# Patient Record
Sex: Male | Born: 1995 | Race: Black or African American | Hispanic: No | Marital: Married | Smoking: Former smoker
Health system: Southern US, Community
[De-identification: ages and names within clinical notes are randomized; demographics above are authoritative.]

## PROBLEM LIST (undated history)

## (undated) DIAGNOSIS — J309 Allergic rhinitis, unspecified: Principal | ICD-10-CM

## (undated) DIAGNOSIS — E669 Obesity, unspecified: Secondary | ICD-10-CM

## (undated) DIAGNOSIS — E559 Vitamin D deficiency, unspecified: Secondary | ICD-10-CM

## (undated) HISTORY — DX: Allergic rhinitis, unspecified: J30.9

## (undated) HISTORY — DX: Obesity, unspecified: E66.9

## (undated) HISTORY — DX: Hypocalcemia: E83.51

## (undated) HISTORY — DX: Vitamin D deficiency, unspecified: E55.9

## (undated) HISTORY — PX: ADENOIDECTOMY: SUR15

---
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
## 2013-02-08 ENCOUNTER — Emergency Department (HOSPITAL_COMMUNITY)
Admission: EM | Admit: 2013-02-08 | Discharge: 2013-02-08 | Disposition: A | Discharge status: Home / Self Care | Payer: Medicaid Other | Attending: Emergency Medicine | Admitting: Emergency Medicine

## 2013-02-08 ENCOUNTER — Encounter (HOSPITAL_COMMUNITY): Payer: Self-pay | Admitting: Emergency Medicine

## 2013-02-08 ENCOUNTER — Emergency Department (HOSPITAL_COMMUNITY): Payer: Medicaid Other

## 2013-02-08 DIAGNOSIS — S63602A Unspecified sprain of left thumb, initial encounter: Secondary | ICD-10-CM

## 2013-02-08 DIAGNOSIS — IMO0002 Reserved for concepts with insufficient information to code with codable children: Secondary | ICD-10-CM | POA: Insufficient documentation

## 2013-02-08 DIAGNOSIS — Y9289 Other specified places as the place of occurrence of the external cause: Secondary | ICD-10-CM | POA: Insufficient documentation

## 2013-02-08 DIAGNOSIS — S6000XA Contusion of unspecified finger without damage to nail, initial encounter: Secondary | ICD-10-CM | POA: Insufficient documentation

## 2013-02-08 DIAGNOSIS — E669 Obesity, unspecified: Secondary | ICD-10-CM | POA: Insufficient documentation

## 2013-02-08 DIAGNOSIS — S60012A Contusion of left thumb without damage to nail, initial encounter: Secondary | ICD-10-CM

## 2013-02-08 DIAGNOSIS — S6390XA Sprain of unspecified part of unspecified wrist and hand, initial encounter: Secondary | ICD-10-CM | POA: Insufficient documentation

## 2013-02-08 DIAGNOSIS — Y9389 Activity, other specified: Secondary | ICD-10-CM | POA: Insufficient documentation

## 2013-02-08 MED ORDER — IBUPROFEN 800 MG PO TABS
800.0000 mg | ORAL_TABLET | Freq: Once | ORAL | Status: AC
  Administered 2013-02-08: 800 mg via ORAL
  Filled 2013-02-08: qty 1

## 2013-02-08 MED ORDER — ACETAMINOPHEN 500 MG PO TABS
500.0000 mg | ORAL_TABLET | Freq: Once | ORAL | Status: AC
  Administered 2013-02-08: 500 mg via ORAL
  Filled 2013-02-08: qty 1

## 2013-02-08 MED ORDER — IBUPROFEN 800 MG PO TABS: 800.0000 mg | ORAL_TABLET | Freq: Three times a day (TID) | ORAL | Status: DC

## 2013-02-08 NOTE — ED Notes (Signed)
Pt reports was at work yesterday and accidentally hit left hand on the wall.  C/O pain and swelling to base of left thumb.

## 2013-02-08 NOTE — ED Provider Notes (Signed)
CSN: [id number]     Arrival date & time 02/08/13  1000 History   First MD Initiated Contact with Patient 02/08/13 1044     Chief Complaint  Patient presents with  . Hand Pain   (Consider location/radiation/quality/duration/timing/severity/associated sxs/prior Treatment) HPI Comments: Pt states he accidentally hit the left hand against a wall and injured the left thumb. Pain is getting progressively worse, and he is noted some swelling. This incident occurred on yesterday 10/23. Patient has pain with attempted down movement. He's not had any previous operations or procedures involving the left thumb. The patient denies being on any blood thinning type medications. There is no history of bleeding disorder. Patient has tried ice and rest without success in alleviating the discomfort.  Patient is a 17 y.o. male presenting with hand pain. The history is provided by the patient.  Hand Pain Pertinent negatives include no abdominal pain, arthralgias, chest pain, coughing or neck pain.    Past Medical History  Diagnosis Date  . Obesity, unspecified 07/04/2012  . Allergic rhinitis 07/04/2012   Past Surgical History  Procedure Laterality Date  . Adenoidectomy     Family History  Problem Relation Age of Onset  . Diabetes Mother   . Heart disease Mother   . Kidney disease Mother   . Heart disease Father    History  Substance Use Topics  . Smoking status: Never Smoker   . Smokeless tobacco: Not on file  . Alcohol Use: No    Review of Systems  Constitutional: Negative for activity change.       All ROS Neg except as noted in HPI  HENT: Negative for nosebleeds.   Eyes: Negative for photophobia and discharge.  Respiratory: Negative for cough, shortness of breath and wheezing.   Cardiovascular: Negative for chest pain and palpitations.  Gastrointestinal: Negative for abdominal pain and blood in stool.  Genitourinary: Negative for dysuria, frequency and hematuria.  Musculoskeletal:  Negative for arthralgias, back pain and neck pain.  Skin: Negative.   Neurological: Negative for dizziness, seizures and speech difficulty.  Psychiatric/Behavioral: Negative for hallucinations and confusion.    Allergies  Review of patient's allergies indicates no known allergies.  Home Medications  No current outpatient prescriptions on file. BP 166/92  Pulse 60  Temp(Src) 97.8 F (36.6 C) (Oral)  Resp 18  Ht 5\' 9"  (1.753 m)  Wt 250 lb (113.399 kg)  BMI 36.9 kg/m2  SpO2 98% Physical Exam  Nursing note and vitals reviewed. Constitutional: He is oriented to person, place, and time. He appears well-developed and well-nourished.  Non-toxic appearance.  HENT:  Head: Normocephalic.  Right Ear: Tympanic membrane and external ear normal.  Left Ear: Tympanic membrane and external ear normal.  Eyes: EOM and lids are normal. Pupils are equal, round, and reactive to light.  Neck: Normal range of motion. Neck supple. Carotid bruit is not present.  Cardiovascular: Normal rate, regular rhythm, normal heart sounds, intact distal pulses and normal pulses.   Pulmonary/Chest: Breath sounds normal. No respiratory distress.  Abdominal: Soft. Bowel sounds are normal. There is no tenderness. There is no guarding.  Musculoskeletal: Normal range of motion.  Range of motion of the left shoulder is full. FROM of the left elbow and wrist. Pain with ROM of the left thumb. Mild swelling  And decrease ROM of the MP of the left thumb. Cap refill less than 2 sec. Radial pulses symmetrical.  Lymphadenopathy:       Head (right side): No submandibular adenopathy present.  Head (left side): No submandibular adenopathy present.    He has no cervical adenopathy.  Neurological: He is alert and oriented to person, place, and time. He has normal strength. No cranial nerve deficit or sensory deficit.  Skin: Skin is warm and dry.  Psychiatric: He has a normal mood and affect. His speech is normal.    ED Course   Procedures (including critical care time) Labs Review Labs Reviewed - No data to display Imaging Review Dg Hand Complete Left  02/08/2013   CLINICAL DATA:  Pain  EXAM: LEFT HAND - COMPLETE 3+ VIEW  COMPARISON:  None.  FINDINGS: Frontal, oblique, and lateral views were obtained. There is evidence of an old healed fracture of the 5th metacarpal. No acute fracture or dislocation. Joint spaces appear intact. No erosive change.  IMPRESSION: No acute fracture or dislocation. Evidence of healed trauma involving 5th metacarpal.   Electronically Signed   By: Bretta Bang M.D.   On: 02/08/2013 11:18    EKG Interpretation   None       MDM  No diagnosis found. **I have reviewed nursing notes, vital signs, and all appropriate lab and imaging results for this patient.*  X-ray of the left hand is negative for fracture or dislocation. Patient fitted with a thumb spica and given an ice pack. Patient will use ibuprofen and Tylenol for soreness and discomfort.  Kathie Dike, PA-C 02/10/13 1921

## 2013-02-13 NOTE — ED Provider Notes (Signed)
Medical screening examination/treatment/procedure(s) were performed by non-physician practitioner and as supervising physician I was immediately available for consultation/collaboration.  Rayane Gallardo L Huntley Knoop, MD 02/13/13 1627 

## 2015-03-19 ENCOUNTER — Encounter (HOSPITAL_COMMUNITY): Payer: Self-pay | Admitting: Emergency Medicine

## 2015-03-19 ENCOUNTER — Emergency Department (HOSPITAL_COMMUNITY)
Admission: EM | Admit: 2015-03-19 | Discharge: 2015-03-19 | Disposition: A | Discharge status: Home / Self Care | Payer: Medicaid Other | Attending: Emergency Medicine | Admitting: Emergency Medicine

## 2015-03-19 DIAGNOSIS — X58XXXA Exposure to other specified factors, initial encounter: Secondary | ICD-10-CM | POA: Insufficient documentation

## 2015-03-19 DIAGNOSIS — E669 Obesity, unspecified: Secondary | ICD-10-CM | POA: Insufficient documentation

## 2015-03-19 DIAGNOSIS — J069 Acute upper respiratory infection, unspecified: Secondary | ICD-10-CM | POA: Insufficient documentation

## 2015-03-19 DIAGNOSIS — Y998 Other external cause status: Secondary | ICD-10-CM | POA: Insufficient documentation

## 2015-03-19 DIAGNOSIS — Y9289 Other specified places as the place of occurrence of the external cause: Secondary | ICD-10-CM | POA: Insufficient documentation

## 2015-03-19 DIAGNOSIS — Y9389 Activity, other specified: Secondary | ICD-10-CM | POA: Insufficient documentation

## 2015-03-19 DIAGNOSIS — S161XXA Strain of muscle, fascia and tendon at neck level, initial encounter: Secondary | ICD-10-CM | POA: Insufficient documentation

## 2015-03-19 MED ORDER — CYCLOBENZAPRINE HCL 10 MG PO TABS: 10.0000 mg | ORAL_TABLET | Freq: Three times a day (TID) | ORAL | Status: DC | PRN

## 2015-03-19 MED ORDER — IBUPROFEN 800 MG PO TABS
800.0000 mg | ORAL_TABLET | Freq: Once | ORAL | Status: AC
  Administered 2015-03-19: 800 mg via ORAL
  Filled 2015-03-19: qty 1

## 2015-03-19 MED ORDER — IBUPROFEN 800 MG PO TABS: 800.0000 mg | ORAL_TABLET | Freq: Three times a day (TID) | ORAL | Status: DC

## 2015-03-19 MED ORDER — GUAIFENESIN-CODEINE 100-10 MG/5ML PO SYRP: 10.0000 mL | ORAL_SOLUTION | Freq: Three times a day (TID) | ORAL | Status: DC | PRN

## 2015-03-19 MED ORDER — CYCLOBENZAPRINE HCL 10 MG PO TABS
10.0000 mg | ORAL_TABLET | Freq: Once | ORAL | Status: AC
  Administered 2015-03-19: 10 mg via ORAL
  Filled 2015-03-19: qty 1

## 2015-03-19 NOTE — ED Notes (Signed)
Complaining of headache, sore throat, cough with some green sputum, pressure in sinuses.

## 2015-03-19 NOTE — ED Notes (Signed)
Pt states understanding of care given and follow up instructions.  Ambulated from ED  

## 2015-03-19 NOTE — Discharge Instructions (Signed)
Cervical Sprain A cervical sprain is when the tissues (ligaments) that hold the neck bones in place stretch or tear. HOME CARE   Put ice on the injured area.  Put ice in a plastic bag.  Place a towel between your skin and the bag.  Leave the ice on for 15-20 minutes, 3-4 times a day.  You may have been given a collar to wear. This collar keeps your neck from moving while you heal.  Do not take the collar off unless told by your doctor.  If you have long hair, keep it outside of the collar.  Ask your doctor before changing the position of your collar. You may need to change its position over time to make it more comfortable.  If you are allowed to take off the collar for cleaning or bathing, follow your doctor's instructions on how to do it safely.  Keep your collar clean by wiping it with mild soap and water. Dry it completely. If the collar has removable pads, remove them every 1-2 days to hand wash them with soap and water. Allow them to air dry. They should be dry before you wear them in the collar.  Do not drive while wearing the collar.  Only take medicine as told by your doctor.  Keep all doctor visits as told.  Keep all physical therapy visits as told.  Adjust your work station so that you have good posture while you work.  Avoid positions and activities that make your problems worse.  Warm up and stretch before being active. GET HELP IF:  Your pain is not controlled with medicine.  You cannot take less pain medicine over time as planned.  Your activity level does not improve as expected. GET HELP RIGHT AWAY IF:   You are bleeding.  Your stomach is upset.  You have an allergic reaction to your medicine.  You develop new problems that you cannot explain.  You lose feeling (become numb) or you cannot move any part of your body (paralysis).  You have tingling or weakness in any part of your body.  Your symptoms get worse. Symptoms include:  Pain,  soreness, stiffness, puffiness (swelling), or a burning feeling in your neck.  Pain when your neck is touched.  Shoulder or upper back pain.  Limited ability to move your neck.  Headache.  Dizziness.  Your hands or arms feel week, lose feeling, or tingle.  Muscle spasms.  Difficulty swallowing or chewing. MAKE SURE YOU:   Understand these instructions.  Will watch your condition.  Will get help right away if you are not doing well or get worse.   This information is not intended to replace advice given to you by your health care provider. Make sure you discuss any questions you have with your health care provider.   Document Released: 09/21/2007 Document Revised: 12/05/2012 Document Reviewed: 10/10/2012 Elsevier Interactive Patient Education 2016 [employer]  Upper Respiratory Infection, Adult Most upper respiratory infections (URIs) are caused by a virus. A URI affects the nose, throat, and upper air passages. The most common type of URI is often called "the common cold." HOME CARE   Take medicines only as told by your doctor.  Gargle warm saltwater or take cough drops to comfort your throat as told by your doctor.  Use a warm mist humidifier or inhale steam from a shower to increase air moisture. This may make it easier to breathe.  Drink enough fluid to keep your pee (urine) clear or  pale yellow.  Eat soups and other clear broths.  Have a healthy diet.  Rest as needed.  Go back to work when your fever is gone or your doctor says it is okay.  You may need to stay home longer to avoid giving your URI to others.  You can also wear a face mask and wash your hands often to prevent spread of the virus.  Use your inhaler more if you have asthma.  Do not use any tobacco products, including cigarettes, chewing tobacco, or electronic cigarettes. If you need help quitting, ask your doctor. GET HELP IF:  You are getting worse, not better.  Your symptoms are not  helped by medicine.  You have chills.  You are getting more short of breath.  You have brown or red mucus.  You have yellow or brown discharge from your nose.  You have pain in your face, especially when you bend forward.  You have a fever.  You have puffy (swollen) neck glands.  You have pain while swallowing.  You have white areas in the back of your throat. GET HELP RIGHT AWAY IF:   You have very bad or constant:  Headache.  Ear pain.  Pain in your forehead, behind your eyes, and over your cheekbones (sinus pain).  Chest pain.  You have long-lasting (chronic) lung disease and any of the following:  Wheezing.  Long-lasting cough.  Coughing up blood.  A change in your usual mucus.  You have a stiff neck.  You have changes in your:  Vision.  Hearing.  Thinking.  Mood. MAKE SURE YOU:   Understand these instructions.  Will watch your condition.  Will get help right away if you are not doing well or get worse.   This information is not intended to replace advice given to you by your health care provider. Make sure you discuss any questions you have with your health care provider.   Document Released: 09/21/2007 Document Revised: 08/19/2014 Document Reviewed: 07/10/2013 Elsevier Interactive Patient Education [employer]

## 2015-03-21 NOTE — ED Provider Notes (Signed)
CSN: [id number]     Arrival date & time 03/19/15  2059 History   First MD Initiated Contact with Patient 03/19/15 2109     Chief Complaint  Patient presents with  . Nasal Congestion     (Consider location/radiation/quality/duration/timing/severity/associated sxs/prior Treatment) HPI  Stephen Silva is a 19 y.o. male who presents to the Emergency Department complaining of sore throat, nasal congestion, sinus pressure and frontal headache.  Occasional cough that is productive at times.  Symptoms began 2-3 days ago.  He states that he is having thick, green nasal mucus.  Describes a pressure sensation to the front of his face.  He has tried OTC cold medications without relief. He also notes "soreness" to the right side of his neck.  Pain is worse with movement and radiates to his shoulder.  He denies injury,neck stiffness, fever, shortness of breath, chest pain.   Past Medical History  Diagnosis Date  . Obesity, unspecified 07/04/2012  . Allergic rhinitis 07/04/2012   Past Surgical History  Procedure Laterality Date  . Adenoidectomy     Family History  Problem Relation Age of Onset  . Diabetes Mother   . Heart disease Mother   . Kidney disease Mother   . Heart disease Father    Social History  Substance Use Topics  . Smoking status: Never Smoker   . Smokeless tobacco: None  . Alcohol Use: No    Review of Systems  Constitutional: Negative for fever, chills, activity change and appetite change.  HENT: Positive for congestion, rhinorrhea, sinus pressure, sneezing and sore throat. Negative for ear pain, facial swelling and trouble swallowing.   Eyes: Negative for visual disturbance.  Respiratory: Positive for cough. Negative for shortness of breath, wheezing and stridor.   Gastrointestinal: Negative for nausea, vomiting and abdominal pain.  Musculoskeletal: Positive for neck pain. Negative for back pain and neck stiffness.  Skin: Negative.  Negative for rash.  Neurological:  Negative for dizziness, weakness, numbness and headaches.  Hematological: Negative for adenopathy.  Psychiatric/Behavioral: Negative for confusion.  All other systems reviewed and are negative.     Allergies  Review of patient's allergies indicates no known allergies.  Home Medications   Prior to Admission medications   Medication Sig Start Date End Date Taking? Authorizing Provider  cyclobenzaprine (FLEXERIL) 10 MG tablet Take 1 tablet (10 mg total) by mouth 3 (three) times daily as needed. 03/19/15   Tinna Kolker, PA-C  guaiFENesin-codeine (ROBITUSSIN AC) 100-10 MG/5ML syrup Take 10 mLs by mouth 3 (three) times daily as needed. 03/19/15   Jashanti Clinkscale, PA-C  ibuprofen (ADVIL,MOTRIN) 800 MG tablet Take 1 tablet (800 mg total) by mouth 3 (three) times daily. 03/19/15   Emory Gallentine, PA-C   BP 170/63 mmHg  Pulse 75  Temp(Src) 97.3 F (36.3 C) (Oral)  Resp 14  Ht 5\' 10"  (1.778 m)  Wt 104.327 kg  BMI 33.00 kg/m2  SpO2 98% Physical Exam  Constitutional: He is oriented to person, place, and time. He appears well-developed and well-nourished. No distress.  HENT:  Head: Normocephalic and atraumatic.  Right Ear: Tympanic membrane and ear canal normal.  Left Ear: Tympanic membrane and ear canal normal.  Nose: Mucosal edema and rhinorrhea present.  Mouth/Throat: Uvula is midline and mucous membranes are normal. No trismus in the jaw. No uvula swelling. Posterior oropharyngeal erythema present. No oropharyngeal exudate, posterior oropharyngeal edema or tonsillar abscesses.  Eyes: Conjunctivae are normal.  Neck: Normal range of motion and phonation normal. Neck supple. No Kernig's  sign noted.  Cardiovascular: Normal rate, regular rhythm, normal heart sounds and intact distal pulses.   Pulmonary/Chest: Effort normal and breath sounds normal. No respiratory distress. He has no wheezes. He has no rales.  Abdominal: Soft. There is no tenderness. There is no rebound.  Musculoskeletal: He  exhibits tenderness. He exhibits no edema.  Localized ttp of the right cervical paraspinal muscles.  No spinal tenderness, no meningeal signs.    Lymphadenopathy:    He has no cervical adenopathy.  Neurological: He is alert and oriented to person, place, and time. He exhibits normal muscle tone. Coordination normal.  Skin: Skin is warm and dry.  Nursing note and vitals reviewed.   ED Course  Procedures (including critical care time)   MDM   Final diagnoses:  URI (upper respiratory infection)  Cervical strain, initial encounter    Pt is well appearing, non-toxic.  Likely viral process.  No meningeal signs.  Cervical strain on right, agrees to symptomatic tx and PMD f/u if needed.      Pauline Aus, PA-C 03/22/15 1242  Vanetta Mulders, MD 03/25/15 1558

## 2017-04-14 ENCOUNTER — Emergency Department (HOSPITAL_COMMUNITY)
Admission: EM | Admit: 2017-04-14 | Discharge: 2017-04-14 | Disposition: A | Discharge status: Home / Self Care | Payer: No Typology Code available for payment source | Attending: Emergency Medicine | Admitting: Emergency Medicine

## 2017-04-14 ENCOUNTER — Encounter (HOSPITAL_COMMUNITY): Payer: Self-pay

## 2017-04-14 ENCOUNTER — Other Ambulatory Visit: Payer: Self-pay

## 2017-04-14 ENCOUNTER — Emergency Department (HOSPITAL_COMMUNITY): Payer: No Typology Code available for payment source

## 2017-04-14 DIAGNOSIS — S161XXA Strain of muscle, fascia and tendon at neck level, initial encounter: Secondary | ICD-10-CM

## 2017-04-14 DIAGNOSIS — Z79899 Other long term (current) drug therapy: Secondary | ICD-10-CM | POA: Insufficient documentation

## 2017-04-14 DIAGNOSIS — S39012A Strain of muscle, fascia and tendon of lower back, initial encounter: Secondary | ICD-10-CM | POA: Diagnosis not present

## 2017-04-14 DIAGNOSIS — Y92410 Unspecified street and highway as the place of occurrence of the external cause: Secondary | ICD-10-CM | POA: Insufficient documentation

## 2017-04-14 DIAGNOSIS — Y999 Unspecified external cause status: Secondary | ICD-10-CM | POA: Insufficient documentation

## 2017-04-14 DIAGNOSIS — Y9389 Activity, other specified: Secondary | ICD-10-CM | POA: Diagnosis not present

## 2017-04-14 MED ORDER — METHOCARBAMOL 500 MG PO TABS: 500.0000 mg | ORAL_TABLET | Freq: Two times a day (BID) | ORAL | 0 refills | Status: DC | PRN

## 2017-04-14 MED ORDER — IBUPROFEN 800 MG PO TABS: 800.0000 mg | ORAL_TABLET | Freq: Three times a day (TID) | ORAL | 0 refills | Status: DC

## 2017-04-14 MED ORDER — IBUPROFEN 800 MG PO TABS
800.0000 mg | ORAL_TABLET | Freq: Once | ORAL | Status: AC
  Administered 2017-04-14: 800 mg via ORAL
  Filled 2017-04-14: qty 1
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
## 2017-04-14 NOTE — Discharge Instructions (Signed)
He should expect to have some pain in her neck and her back for the next 7-10 days or so, you may find some improvement with cold compresses, ibuprofen or Robaxin which is a muscle relaxer.  If you are taking the strong medications you should not drive a vehicle or do other important tasks.
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
## 2017-04-23 ENCOUNTER — Encounter (HOSPITAL_COMMUNITY): Payer: Self-pay | Admitting: Emergency Medicine

## 2017-04-23 ENCOUNTER — Emergency Department (HOSPITAL_COMMUNITY)
Admission: EM | Admit: 2017-04-23 | Discharge: 2017-04-23 | Disposition: A | Discharge status: Home / Self Care | Payer: No Typology Code available for payment source | Attending: Emergency Medicine | Admitting: Emergency Medicine

## 2017-04-23 ENCOUNTER — Other Ambulatory Visit: Payer: Self-pay

## 2017-04-23 DIAGNOSIS — S4991XD Unspecified injury of right shoulder and upper arm, subsequent encounter: Secondary | ICD-10-CM | POA: Diagnosis present

## 2017-04-23 DIAGNOSIS — T148XXA Other injury of unspecified body region, initial encounter: Secondary | ICD-10-CM

## 2017-04-23 DIAGNOSIS — S46811D Strain of other muscles, fascia and tendons at shoulder and upper arm level, right arm, subsequent encounter: Secondary | ICD-10-CM | POA: Diagnosis not present

## 2017-04-23 DIAGNOSIS — S46812D Strain of other muscles, fascia and tendons at shoulder and upper arm level, left arm, subsequent encounter: Secondary | ICD-10-CM | POA: Diagnosis not present

## 2017-04-23 MED ORDER — IBUPROFEN 600 MG PO TABS: 600.0000 mg | ORAL_TABLET | Freq: Four times a day (QID) | ORAL | 0 refills | Status: DC

## 2017-04-23 MED ORDER — DEXAMETHASONE 4 MG PO TABS: 4.0000 mg | ORAL_TABLET | Freq: Two times a day (BID) | ORAL | 0 refills | Status: DC

## 2017-04-23 MED ORDER — CYCLOBENZAPRINE HCL 10 MG PO TABS: 10.0000 mg | ORAL_TABLET | Freq: Three times a day (TID) | ORAL | 0 refills | Status: DC
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
## 2017-04-23 NOTE — ED Notes (Signed)
EDP at bedside  
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
## 2017-04-23 NOTE — Discharge Instructions (Signed)
Your vital signs are within normal limits with exception of your blood pressure being elevated at 152/74.  Your oxygen level is 100% on room air, which is within normal limits.  No gross neurologic or vascular deficits appreciated on your examination at this time.  Please stop the Robaxin.  Please use Flexeril 3 times daily as needed for spasm related pain.  This medication may cause drowsiness.  Please do not drive, drink, or operate machinery when taking this medication.  Please use Decadron 2 times daily with food.  Please use ibuprofen with breakfast, lunch, dinner, and at bedtime.  Please see Dr. Romeo AppleHarrison for orthopedic evaluation concerning the continued soreness involving her muscles, or the orthopedic specialist of your choice.

## 2017-07-10 ENCOUNTER — Emergency Department (HOSPITAL_COMMUNITY)
Admission: EM | Admit: 2017-07-10 | Discharge: 2017-07-10 | Disposition: A | Discharge status: Home / Self Care | Payer: Self-pay | Attending: Emergency Medicine | Admitting: Emergency Medicine

## 2017-07-10 ENCOUNTER — Encounter (HOSPITAL_COMMUNITY): Payer: Self-pay | Admitting: Cardiology

## 2017-07-10 ENCOUNTER — Other Ambulatory Visit: Payer: Self-pay

## 2017-07-10 DIAGNOSIS — J020 Streptococcal pharyngitis: Secondary | ICD-10-CM | POA: Insufficient documentation

## 2017-07-10 MED ORDER — PENICILLIN G BENZATHINE 1200000 UNIT/2ML IM SUSP
1.2000 10*6.[IU] | Freq: Once | INTRAMUSCULAR | Status: AC
  Administered 2017-07-10: 1.2 10*6.[IU] via INTRAMUSCULAR
  Filled 2017-07-10: qty 2

## 2017-07-10 MED ORDER — DIPHENHYDRAMINE HCL 25 MG PO CAPS
25.0000 mg | ORAL_CAPSULE | Freq: Once | ORAL | Status: AC
  Administered 2017-07-10: 25 mg via ORAL
  Filled 2017-07-10: qty 1

## 2017-07-10 MED ORDER — IBUPROFEN 400 MG PO TABS: 400.0000 mg | ORAL_TABLET | Freq: Four times a day (QID) | ORAL | 0 refills | Status: DC | PRN

## 2017-07-10 MED ORDER — DEXAMETHASONE 4 MG PO TABS
10.0000 mg | ORAL_TABLET | Freq: Once | ORAL | Status: AC
  Administered 2017-07-10: 10 mg via ORAL
  Filled 2017-07-10: qty 3

## 2017-07-10 MED ORDER — IBUPROFEN 800 MG PO TABS
800.0000 mg | ORAL_TABLET | Freq: Once | ORAL | Status: AC
  Administered 2017-07-10: 800 mg via ORAL
  Filled 2017-07-10: qty 1

## 2017-07-10 MED ORDER — DIPHENHYDRAMINE HCL 25 MG PO TABS: 25.0000 mg | ORAL_TABLET | Freq: Four times a day (QID) | ORAL | 0 refills | Status: DC | PRN

## 2017-07-10 NOTE — ED Provider Notes (Signed)
Emergency Department Provider Note   I have reviewed the triage vital signs and the nursing notes.   HISTORY  Chief Complaint No chief complaint on file.   HPI Stephen Silva is a 22 y.o. male with a few days of progressively worsening sore throat, headache, runny nose, anterior neck pain and fevers.  Patient states he did not get flu shot this year has not been around anyone sick.  However he does work at [employer].  No trouble swallowing or breathing at this time.  He will his secretions well.  No history of anything similar to this.  Also states he has some associated runny eyes. No other associated or modifying symptoms.    Past Medical History:  Diagnosis Date  . Allergic rhinitis 07/04/2012  . Obesity, unspecified 07/04/2012    Patient Active Problem List   Diagnosis Date Noted  . Obesity, unspecified 07/04/2012  . Allergic rhinitis 07/04/2012    Past Surgical History:  Procedure Laterality Date  . ADENOIDECTOMY      Current Outpatient Rx  . Order #: [id number] Class: Print  . Order #: [id number] Class: Print    Allergies Patient has no known allergies.  Family History  Problem Relation Age of Onset  . Diabetes Mother   . Heart disease Mother   . Kidney disease Mother   . Heart disease Father     Social History Social History   Tobacco Use  . Smoking status: Never Smoker  . Smokeless tobacco: Never Used  Substance Use Topics  . Alcohol use: No  . Drug use: No    Review of Systems  All other systems negative except as documented in the HPI. All pertinent positives and negatives as reviewed in the HPI. ____________________________________________   PHYSICAL EXAM:  VITAL SIGNS: ED Triage Vitals  Enc Vitals Group     BP 07/10/17 0905 (!) 141/79     Pulse Rate 07/10/17 0905 66     Resp 07/10/17 0905 16     Temp 07/10/17 0905 98.7 F (37.1 C)     Temp Source 07/10/17 0905 Oral     SpO2 07/10/17 0905 95 %     Weight 07/10/17 0905 295 lb  (133.8 kg)     Height 07/10/17 0905 5\' 10"  (1.778 m)     Head Circumference --      Peak Flow --      Pain Score 07/10/17 0904 10     Pain Loc --      Pain Edu? --      Excl. in GC? --     Constitutional: Alert and oriented. Well appearing and in no acute distress. Eyes: Conjunctivae are normal. PERRL. EOMI. Head: Atraumatic. Nose: No congestion/rhinnorhea. Mouth/Throat: Mucous membranes are moist.  Oropharynx erythematous, with some petechiae and exudate. Neck: No stridor.  No meningeal signs.  Anterior cervical lymphadenopathy. Cardiovascular: Normal rate, regular rhythm. Good peripheral circulation. Grossly normal heart sounds.   Respiratory: Normal respiratory effort.  No retractions. Lungs CTAB. Gastrointestinal: Soft and nontender. No distention.  Musculoskeletal: No lower extremity tenderness nor edema. No gross deformities of extremities. Neurologic:  Normal speech and language. No gross focal neurologic deficits are appreciated.  Skin:  Skin is warm, dry and intact. No rash noted.  ____________________________________________   INITIAL IMPRESSION / ASSESSMENT AND PLAN / ED COURSE  Suspect strep throat as he has all of Cent[facility] criteria.  We will treat for the same.  Stable for discharge without any evidence of respiratory distress or obstruction.  Pertinent labs & imaging results that were available during my care of the patient were reviewed by me and considered in my medical decision making (see chart for details).  ____________________________________________  FINAL CLINICAL IMPRESSION(S) / ED DIAGNOSES  Final diagnoses:  Strep throat     MEDICATIONS GIVEN DURING THIS VISIT:  Medications  penicillin g benzathine (BICILLIN LA) 1200000 UNIT/2ML injection 1.2 Million Units (has no administration in time range)  dexamethasone (DECADRON) tablet 10 mg (has no administration in time range)  ibuprofen (ADVIL,MOTRIN) tablet 800 mg (has no administration in time  range)  diphenhydrAMINE (BENADRYL) capsule 25 mg (has no administration in time range)     NEW OUTPATIENT MEDICATIONS STARTED DURING THIS VISIT:  New Prescriptions   DIPHENHYDRAMINE (BENADRYL) 25 MG TABLET    Take 1 tablet (25 mg total) by mouth every 6 (six) hours as needed.   IBUPROFEN (ADVIL,MOTRIN) 400 MG TABLET    Take 1 tablet (400 mg total) by mouth every 6 (six) hours as needed.    Note:  This note was prepared with assistance of Dragon voice recognition software. Occasional wrong-word or sound-a-like substitutions may have occurred due to the inherent limitations of voice recognition software.   Leanthony Rhett, Barbara CowerJason, MD 07/10/17 [phone]

## 2017-07-10 NOTE — ED Triage Notes (Signed)
Nasal congestion and cough since Friday  

## 2019-04-02 ENCOUNTER — Emergency Department (HOSPITAL_COMMUNITY)
Admission: EM | Admit: 2019-04-02 | Discharge: 2019-04-02 | Disposition: A | Discharge status: Home / Self Care | Payer: Self-pay | Attending: Emergency Medicine | Admitting: Emergency Medicine

## 2019-04-02 ENCOUNTER — Emergency Department (HOSPITAL_COMMUNITY): Payer: Self-pay

## 2019-04-02 ENCOUNTER — Other Ambulatory Visit: Payer: Self-pay

## 2019-04-02 ENCOUNTER — Encounter (HOSPITAL_COMMUNITY): Payer: Self-pay

## 2019-04-02 DIAGNOSIS — R059 Cough, unspecified: Secondary | ICD-10-CM

## 2019-04-02 DIAGNOSIS — Z20828 Contact with and (suspected) exposure to other viral communicable diseases: Secondary | ICD-10-CM | POA: Insufficient documentation

## 2019-04-02 DIAGNOSIS — R05 Cough: Secondary | ICD-10-CM | POA: Insufficient documentation

## 2019-04-02 MED ORDER — BENZONATATE 200 MG PO CAPS: 200.0000 mg | ORAL_CAPSULE | Freq: Three times a day (TID) | ORAL | 0 refills | Status: DC | PRN

## 2019-04-02 NOTE — ED Provider Notes (Signed)
[facility] EMERGENCY DEPARTMENT Provider Note   CSN: [id number] Arrival date & time: 04/02/19  1154     History Chief Complaint  Patient presents with  . Cough    Stephen Silva is a 23 y.o. male.  HPI      Stephen Silva is a 23 y.o. male who presents to the Emergency Department complaining of persistent cough for 2 months.  States the cough is nonproductive.  He also describes having generalized body aches for 1 week.  Cough is not associated with chest pain or shortness of breath.  He denies sore throat, fever, chills, or loss of taste or smell.  He is requesting a Covid test. No family members are currently symptomatic.    Past Medical History:  Diagnosis Date  . Allergic rhinitis 07/04/2012  . Obesity, unspecified 07/04/2012    Patient Active Problem List   Diagnosis Date Noted  . Obesity, unspecified 07/04/2012  . Allergic rhinitis 07/04/2012    Past Surgical History:  Procedure Laterality Date  . ADENOIDECTOMY         Family History  Problem Relation Age of Onset  . Diabetes Mother   . Heart disease Mother   . Kidney disease Mother   . Heart disease Father     Social History   Tobacco Use  . Smoking status: Never Smoker  . Smokeless tobacco: Never Used  Substance Use Topics  . Alcohol use: No  . Drug use: No    Home Medications Prior to Admission medications   Medication Sig Start Date End Date Taking? Authorizing Provider  ibuprofen (ADVIL) 200 MG tablet Take 200-400 mg by mouth every 6 (six) hours as needed for fever or moderate pain.   Yes [provider]  benzonatate (TESSALON) 200 MG capsule Take 1 capsule (200 mg total) by mouth 3 (three) times daily as needed for cough. Swallow whole, do not chew 04/02/19   Fedrick Cefalu, PA-C    Allergies    Patient has no known allergies.  Review of Systems   Review of Systems  Constitutional: Negative for appetite change, chills and fever.  HENT: Negative for congestion,  rhinorrhea, sore throat and trouble swallowing.   Respiratory: Positive for cough. Negative for chest tightness, shortness of breath and wheezing.   Cardiovascular: Negative for chest pain.  Gastrointestinal: Negative for abdominal pain, nausea and vomiting.  Genitourinary: Negative for dysuria.  Musculoskeletal: Positive for myalgias. Negative for arthralgias.  Skin: Negative for rash.  Neurological: Negative for dizziness, weakness, numbness and headaches.  Hematological: Negative for adenopathy.    Physical Exam Updated Vital Signs BP (!) 149/76 (BP Location: Right Arm)   Pulse 98   Temp 98.2 F (36.8 C) (Oral)   Resp 16   Ht 5\' 11"  (1.803 m)   Wt 136.1 kg   SpO2 99%   BMI 41.84 kg/m   Physical Exam Vitals and nursing note reviewed.  Constitutional:      Appearance: Normal appearance. He is not ill-appearing or toxic-appearing.  HENT:     Mouth/Throat:     Mouth: Mucous membranes are moist.  Cardiovascular:     Rate and Rhythm: Normal rate and regular rhythm.     Pulses: Normal pulses.  Pulmonary:     Effort: Pulmonary effort is normal.     Breath sounds: Normal breath sounds. No wheezing.  Chest:     Chest wall: No tenderness.  Abdominal:     Palpations: Abdomen is soft.     Tenderness:  There is no abdominal tenderness.  Musculoskeletal:        General: Normal range of motion.  Skin:    General: Skin is warm.  Neurological:     General: No focal deficit present.     Mental Status: He is alert.     Sensory: Sensation is intact. No sensory deficit.     Motor: Motor function is intact. No weakness.     Comments: CN II-XII grossly intact     ED Results / Procedures / Treatments   Labs (all labs ordered are listed, but only abnormal results are displayed) Labs Reviewed  NOVEL CORONAVIRUS, NAA (HOSP ORDER, SEND-OUT TO REF LAB; TAT 18-24 HRS)    EKG None  Radiology DG Chest Portable 1 View  Result Date: 04/02/2019 CLINICAL DATA:  Cough for 2 months.   Body aches for 1 week. EXAM: PORTABLE CHEST 1 VIEW COMPARISON:  07/01/2012 FINDINGS: Normal heart, mediastinum and hila. Clear lungs.  No pleural effusion or pneumothorax. Skeletal structures are grossly unremarkable. IMPRESSION: No active disease. Electronically Signed   By: Amie Portland M.D.   On: 04/02/2019 13:03    Procedures Procedures (including critical care time)  Medications Ordered in ED Medications - No data to display  ED Course  I have reviewed the triage vital signs and the nursing notes.  Pertinent labs & imaging results that were available during my care of the patient were reviewed by me and considered in my medical decision making (see chart for details).    MDM Rules/Calculators/A&P                       Patient well-appearing.  Vital signs reviewed.  Chest x-ray is negative for acute process.  Patient reports intermittent cough for 2 months, no cough during exam. No labored breathing.   He is also requesting a Covid test, I feel this is appropriate.  He agrees to isolate at home until results are back if negative and 10 days if positive.  Return precautions discussed.    Final Clinical Impression(s) / ED Diagnoses Final diagnoses:  Cough    Rx / DC Orders ED Discharge Orders         Ordered    benzonatate (TESSALON) 200 MG capsule  3 times daily PRN     04/02/19 1336           Pauline Aus, PA-C 04/02/19 1407    Bethann Berkshire, MD 04/03/19 [phone]

## 2019-04-02 NOTE — ED Triage Notes (Signed)
Patient complaining of cough x2 months and body aches x1 week. Requesting COVID test.

## 2019-04-02 NOTE — Discharge Instructions (Addendum)
You have been tested today for COVID-19.  Your test results are pending and should be back within 24 to 48 hours.  You may review your results in MyChart.  You will need to isolate at home at least until your results are back.  Follow-up with your primary doctor for recheck if needed.

## 2019-04-03 LAB — NOVEL CORONAVIRUS, NAA (HOSP ORDER, SEND-OUT TO REF LAB; TAT 18-24 HRS): SARS-CoV-2, NAA: NOT DETECTED

## 2019-06-18 ENCOUNTER — Ambulatory Visit: Payer: Self-pay | Attending: Internal Medicine

## 2019-06-18 ENCOUNTER — Other Ambulatory Visit: Payer: Self-pay

## 2019-06-18 DIAGNOSIS — Z20822 Contact with and (suspected) exposure to covid-19: Secondary | ICD-10-CM | POA: Insufficient documentation

## 2019-06-19 LAB — NOVEL CORONAVIRUS, NAA: SARS-CoV-2, NAA: NOT DETECTED

## 2019-10-08 ENCOUNTER — Inpatient Hospital Stay (HOSPITAL_COMMUNITY)
Admission: RE | Admit: 2019-10-08 | Discharge: 2019-10-10 | DRG: 638 | Disposition: A | Discharge status: Home / Self Care | Payer: Self-pay | Attending: Family Medicine | Admitting: Family Medicine

## 2019-10-08 ENCOUNTER — Encounter (HOSPITAL_COMMUNITY): Payer: Self-pay | Admitting: Emergency Medicine

## 2019-10-08 ENCOUNTER — Other Ambulatory Visit: Payer: Self-pay

## 2019-10-08 DIAGNOSIS — Z6841 Body Mass Index (BMI) 40.0 and over, adult: Secondary | ICD-10-CM

## 2019-10-08 DIAGNOSIS — E1165 Type 2 diabetes mellitus with hyperglycemia: Secondary | ICD-10-CM

## 2019-10-08 DIAGNOSIS — IMO0002 Reserved for concepts with insufficient information to code with codable children: Secondary | ICD-10-CM

## 2019-10-08 DIAGNOSIS — E119 Type 2 diabetes mellitus without complications: Secondary | ICD-10-CM

## 2019-10-08 DIAGNOSIS — B372 Candidiasis of skin and nail: Secondary | ICD-10-CM | POA: Diagnosis present

## 2019-10-08 DIAGNOSIS — E876 Hypokalemia: Secondary | ICD-10-CM

## 2019-10-08 DIAGNOSIS — E1110 Type 2 diabetes mellitus with ketoacidosis without coma: Principal | ICD-10-CM | POA: Diagnosis present

## 2019-10-08 DIAGNOSIS — E1310 Other specified diabetes mellitus with ketoacidosis without coma: Secondary | ICD-10-CM

## 2019-10-08 DIAGNOSIS — Z20822 Contact with and (suspected) exposure to covid-19: Secondary | ICD-10-CM | POA: Diagnosis present

## 2019-10-08 DIAGNOSIS — I10 Essential (primary) hypertension: Secondary | ICD-10-CM

## 2019-10-08 DIAGNOSIS — F17210 Nicotine dependence, cigarettes, uncomplicated: Secondary | ICD-10-CM | POA: Diagnosis present

## 2019-10-08 DIAGNOSIS — E559 Vitamin D deficiency, unspecified: Secondary | ICD-10-CM | POA: Diagnosis present

## 2019-10-08 DIAGNOSIS — N481 Balanitis: Secondary | ICD-10-CM | POA: Diagnosis present

## 2019-10-08 HISTORY — DX: Type 2 diabetes mellitus with hyperglycemia: E11.65

## 2019-10-08 HISTORY — DX: Reserved for concepts with insufficient information to code with codable children: IMO0002

## 2019-10-08 HISTORY — DX: Essential (primary) hypertension: I10

## 2019-10-08 LAB — BASIC METABOLIC PANEL
Anion gap: 17 — ABNORMAL HIGH (ref 5–15)
Anion gap: 15 (ref 5–15)
Anion gap: 11 (ref 5–15)
BUN: 15 mg/dL (ref 6–20)
BUN: 13 mg/dL (ref 6–20)
BUN: 10 mg/dL (ref 6–20)
CO2: 23 mmol/L (ref 22–32)
CO2: 21 mmol/L — ABNORMAL LOW (ref 22–32)
CO2: 25 mmol/L (ref 22–32)
Calcium: 9.6 mg/dL (ref 8.9–10.3)
Calcium: 9 mg/dL (ref 8.9–10.3)
Calcium: 8.4 mg/dL — ABNORMAL LOW (ref 8.9–10.3)
Chloride: 93 mmol/L — ABNORMAL LOW (ref 98–111)
Chloride: 100 mmol/L (ref 98–111)
Chloride: 101 mmol/L (ref 98–111)
Creatinine, Ser: 0.98 mg/dL (ref 0.61–1.24)
Creatinine, Ser: 0.84 mg/dL (ref 0.61–1.24)
Creatinine, Ser: 0.76 mg/dL (ref 0.61–1.24)
GFR calc Af Amer: >60 mL/min (ref >60)
GFR calc Af Amer: >60 mL/min (ref >60)
GFR calc Af Amer: >60 mL/min (ref >60)
GFR calc non Af Amer: >60 mL/min (ref >60)
GFR calc non Af Amer: >60 mL/min (ref >60)
GFR calc non Af Amer: >60 mL/min (ref >60)
Glucose, Bld: 465 mg/dL — ABNORMAL HIGH (ref 70–99)
Glucose, Bld: 295 mg/dL — ABNORMAL HIGH (ref 70–99)
Glucose, Bld: 250 mg/dL — ABNORMAL HIGH (ref 70–99)
Potassium: 4.6 mmol/L (ref 3.5–5.1)
Potassium: 3.8 mmol/L (ref 3.5–5.1)
Potassium: 3.7 mmol/L (ref 3.5–5.1)
Sodium: 133 mmol/L — ABNORMAL LOW (ref 135–145)
Sodium: 136 mmol/L (ref 135–145)
Sodium: 137 mmol/L (ref 135–145)

## 2019-10-08 LAB — URINALYSIS, ROUTINE W REFLEX MICROSCOPIC
Bacteria, UA: NONE SEEN
Bilirubin Urine: NEGATIVE
Glucose, UA: >=500 mg/dL — AB
Ketones, ur: 80 mg/dL — AB
Nitrite: NEGATIVE
Protein, ur: 30 mg/dL — AB
Specific Gravity, Urine: 1.031 — ABNORMAL HIGH (ref 1.005–1.030)
WBC, UA: >50 WBC/hpf — ABNORMAL HIGH (ref 0–5)
pH: 5 (ref 5.0–8.0)

## 2019-10-08 LAB — COMPREHENSIVE METABOLIC PANEL
ALT: 55 U/L — ABNORMAL HIGH (ref 0–44)
AST: 32 U/L (ref 15–41)
Albumin: 4.4 g/dL (ref 3.5–5.0)
Alkaline Phosphatase: 128 U/L — ABNORMAL HIGH (ref 38–126)
Anion gap: 20 — ABNORMAL HIGH (ref 5–15)
BUN: 16 mg/dL (ref 6–20)
CO2: 24 mmol/L (ref 22–32)
Calcium: 10.2 mg/dL (ref 8.9–10.3)
Chloride: 89 mmol/L — ABNORMAL LOW (ref 98–111)
Creatinine, Ser: 1.05 mg/dL (ref 0.61–1.24)
GFR calc Af Amer: >60 mL/min (ref >60)
GFR calc non Af Amer: >60 mL/min (ref >60)
Glucose, Bld: 507 mg/dL (ref 70–99)
Potassium: 4.5 mmol/L (ref 3.5–5.1)
Sodium: 133 mmol/L — ABNORMAL LOW (ref 135–145)
Total Bilirubin: 1.3 mg/dL — ABNORMAL HIGH (ref 0.3–1.2)
Total Protein: 8.5 g/dL — ABNORMAL HIGH (ref 6.5–8.1)

## 2019-10-08 LAB — BETA-HYDROXYBUTYRIC ACID
Beta-Hydroxybutyric Acid: 4.2 mmol/L — ABNORMAL HIGH (ref 0.05–0.27)
Beta-Hydroxybutyric Acid: 1.63 mmol/L — ABNORMAL HIGH (ref 0.05–0.27)

## 2019-10-08 LAB — HEMOGLOBIN A1C
Hgb A1c MFr Bld: 13.9 % — ABNORMAL HIGH (ref 4.8–5.6)
Mean Plasma Glucose: 352.23 mg/dL

## 2019-10-08 LAB — CBC
HCT: 49.6 % (ref 39.0–52.0)
Hemoglobin: 15.7 g/dL (ref 13.0–17.0)
MCH: 24.6 pg — ABNORMAL LOW (ref 26.0–34.0)
MCHC: 31.7 g/dL (ref 30.0–36.0)
MCV: 77.6 fL — ABNORMAL LOW (ref 80.0–100.0)
Platelets: 279 10*3/uL (ref 150–400)
RBC: 6.39 MIL/uL — ABNORMAL HIGH (ref 4.22–5.81)
RDW: 13.2 % (ref 11.5–15.5)
WBC: 9.9 10*3/uL (ref 4.0–10.5)
nRBC: 0 % (ref 0.0–0.2)

## 2019-10-08 LAB — SARS CORONAVIRUS 2 BY RT PCR (HOSPITAL ORDER, PERFORMED IN ~~LOC~~ HOSPITAL LAB): SARS Coronavirus 2: NEGATIVE

## 2019-10-08 LAB — BLOOD GAS, VENOUS
Acid-base deficit: 1.2 mmol/L (ref 0.0–2.0)
Bicarbonate: 22.5 mmol/L (ref 20.0–28.0)
FIO2: 21
O2 Saturation: 74.9 %
Patient temperature: 37
pCO2, Ven: 43.2 mmHg — ABNORMAL LOW (ref 44.0–60.0)
pH, Ven: 7.356 (ref 7.250–7.430)
pO2, Ven: 44.1 mmHg (ref 32.0–45.0)

## 2019-10-08 LAB — CBG MONITORING, ED
Glucose-Capillary: 334 mg/dL — ABNORMAL HIGH (ref 70–99)
Glucose-Capillary: 261 mg/dL — ABNORMAL HIGH (ref 70–99)
Glucose-Capillary: 224 mg/dL — ABNORMAL HIGH (ref 70–99)
Glucose-Capillary: 238 mg/dL — ABNORMAL HIGH (ref 70–99)
Glucose-Capillary: 216 mg/dL — ABNORMAL HIGH (ref 70–99)
Glucose-Capillary: 250 mg/dL — ABNORMAL HIGH (ref 70–99)
Glucose-Capillary: 198 mg/dL — ABNORMAL HIGH (ref 70–99)

## 2019-10-08 LAB — GLUCOSE, CAPILLARY: Glucose-Capillary: 182 mg/dL — ABNORMAL HIGH (ref 70–99)

## 2019-10-08 MED ORDER — ALBUTEROL SULFATE (2.5 MG/3ML) 0.083% IN NEBU: 2.5000 mg | INHALATION_SOLUTION | RESPIRATORY_TRACT | Status: DC | PRN

## 2019-10-08 MED ORDER — DEXTROSE-NACL 5-0.45 % IV SOLN
INTRAVENOUS | Status: DC
Start: 2019-10-08 — End: 2019-10-09

## 2019-10-08 MED ORDER — SODIUM CHLORIDE 0.9 % IV SOLN: 250.0000 mL | INTRAVENOUS | Status: DC | PRN

## 2019-10-08 MED ORDER — SODIUM CHLORIDE 0.9% FLUSH
3.0000 mL | Freq: Two times a day (BID) | INTRAVENOUS | Status: DC
  Administered 2019-10-10: 3 mL via INTRAVENOUS

## 2019-10-08 MED ORDER — ONDANSETRON HCL 4 MG PO TABS: 4.0000 mg | ORAL_TABLET | Freq: Four times a day (QID) | ORAL | Status: DC | PRN

## 2019-10-08 MED ORDER — SODIUM CHLORIDE 0.9% FLUSH: 3.0000 mL | INTRAVENOUS | Status: DC | PRN

## 2019-10-08 MED ORDER — LABETALOL HCL 5 MG/ML IV SOLN
10.0000 mg | INTRAVENOUS | Status: DC | PRN
  Administered 2019-10-09 – 2019-10-10 (×3): 10 mg via INTRAVENOUS
  Filled 2019-10-08 (×3): qty 4

## 2019-10-08 MED ORDER — POLYETHYLENE GLYCOL 3350 17 G PO PACK: 17.0000 g | PACK | Freq: Every day | ORAL | Status: DC | PRN

## 2019-10-08 MED ORDER — INSULIN REGULAR(HUMAN) IN NACL 100-0.9 UT/100ML-% IV SOLN
INTRAVENOUS | Status: DC
Start: 2019-10-08 — End: 2019-10-10
  Administered 2019-10-08: 13:00:00 10.5 [IU]/h via INTRAVENOUS
  Administered 2019-10-09: 02:00:00 5.5 [IU]/h via INTRAVENOUS
  Filled 2019-10-08 (×2): qty 100

## 2019-10-08 MED ORDER — POTASSIUM CHLORIDE 10 MEQ/100ML IV SOLN
10.0000 meq | INTRAVENOUS | Status: AC
  Administered 2019-10-08: 13:00:00 10 meq via INTRAVENOUS
  Filled 2019-10-08: qty 100

## 2019-10-08 MED ORDER — SODIUM CHLORIDE 0.9 % IV SOLN: INTRAVENOUS | Status: DC

## 2019-10-08 MED ORDER — TRAZODONE HCL 50 MG PO TABS: 50.0000 mg | ORAL_TABLET | Freq: Every evening | ORAL | Status: DC | PRN

## 2019-10-08 MED ORDER — SODIUM CHLORIDE 0.9 % IV BOLUS
1000.0000 mL | INTRAVENOUS | Status: AC
  Administered 2019-10-08: 15:00:00 1000 mL via INTRAVENOUS

## 2019-10-08 MED ORDER — DEXTROSE 50 % IV SOLN: 0.0000 mL | INTRAVENOUS | Status: DC | PRN

## 2019-10-08 MED ORDER — SODIUM CHLORIDE 0.9 % IV BOLUS
1000.0000 mL | Freq: Once | INTRAVENOUS | Status: AC
  Administered 2019-10-08: 1000 mL via INTRAVENOUS

## 2019-10-08 MED ORDER — SODIUM CHLORIDE 0.9 % IV BOLUS
1000.0000 mL | Freq: Once | INTRAVENOUS | Status: AC
  Administered 2019-10-08: 12:00:00 1000 mL via INTRAVENOUS

## 2019-10-08 MED ORDER — ONDANSETRON HCL 4 MG/2ML IJ SOLN: 4.0000 mg | Freq: Four times a day (QID) | INTRAMUSCULAR | Status: DC | PRN

## 2019-10-08 MED ORDER — HEPARIN SODIUM (PORCINE) 5000 UNIT/ML IJ SOLN
5000.0000 [IU] | Freq: Three times a day (TID) | INTRAMUSCULAR | Status: DC
  Administered 2019-10-08 – 2019-10-10 (×5): 5000 [IU] via SUBCUTANEOUS
  Filled 2019-10-08 (×5): qty 1

## 2019-10-08 NOTE — ED Notes (Signed)
Date and time results received: 10/08/19 1225  Test: Glucose  Critical Value: 507  Name of Provider Notified: Farrel Gordon, PA  Orders Received? Or Actions Taken?: See chart

## 2019-10-08 NOTE — ED Notes (Signed)
RN and PA notified of CBG 334.

## 2019-10-08 NOTE — ED Notes (Signed)
Pt aware a urine specimen is needed. Pt will notify this RN when one can be obtained.  

## 2019-10-08 NOTE — ED Triage Notes (Signed)
Pt c/o of dysuria x 2 weeks, denies exposure to STD.

## 2019-10-08 NOTE — ED Provider Notes (Signed)
[facility] EMERGENCY DEPARTMENT Provider Note   CSN: [id number] Arrival date & time: 10/08/19  1005     History Chief Complaint  Patient presents with  . Dysuria    Stephen Silva is a 24 y.o. male with pertinent past medical history of obesity that presents the emergency department today for dysuria.  States that dysuria started 2 weeks ago and has been progressing.  Also states that he has noticed some white stuff under his foreskin.  States that he has been bathing in bleach to help prevent this.  Has also noticed some frequency.  Denies any urgency or hematuria.  Denies any other symptoms.  Denies any penile discharge, scrotal swelling, erythema, masses. Denies any back pain, nausea, vomiting, abdominal pain, suprapubic pain, chest pain, shortness of breath.  States that he did feel dizzy this morning, has resolved.  Denies any room spinning sensation.  States that he is currently trying to stop smoking cigarettes.  Denies any headache, vision changes, paresthesias, weakness.  Patient denies being sexually active, does not want STD testing at this time.  Patient has never been diagnosed with diabetes before.    HPI     Past Medical History:  Diagnosis Date  . Allergic rhinitis 07/04/2012  . Obesity, unspecified 07/04/2012    Patient Active Problem List   Diagnosis Date Noted  . DKA (diabetic ketoacidoses) (HCC) 10/08/2019  . Obesity, unspecified 07/04/2012  . Allergic rhinitis 07/04/2012    Past Surgical History:  Procedure Laterality Date  . ADENOIDECTOMY         Family History  Problem Relation Age of Onset  . Diabetes Mother   . Heart disease Mother   . Kidney disease Mother   . Heart disease Father     Social History   Tobacco Use  . Smoking status: Never Smoker  . Smokeless tobacco: Never Used  Vaping Use  . Vaping Use: Never used  Substance Use Topics  . Alcohol use: No  . Drug use: No    Home Medications Prior to Admission medications     Medication Sig Start Date End Date Taking? Authorizing Provider  ibuprofen (ADVIL) 200 MG tablet Take 200-400 mg by mouth every 6 (six) hours as needed for fever or moderate pain.   Yes [provider]  benzonatate (TESSALON) 200 MG capsule Take 1 capsule (200 mg total) by mouth 3 (three) times daily as needed for cough. Swallow whole, do not chew Patient not taking: Reported on 10/08/2019 04/02/19   Pauline Aus, PA-C    Allergies    Patient has no known allergies.  Review of Systems   Review of Systems  Constitutional: Negative for chills, diaphoresis, fatigue and fever.  HENT: Negative for congestion, sore throat and trouble swallowing.   Eyes: Negative for pain and visual disturbance.  Respiratory: Negative for cough, shortness of breath and wheezing.   Cardiovascular: Negative for chest pain, palpitations and leg swelling.  Gastrointestinal: Negative for abdominal distention, abdominal pain, diarrhea, nausea and vomiting.  Genitourinary: Positive for dysuria. Negative for decreased urine volume, difficulty urinating, enuresis, flank pain, frequency, hematuria, penile pain, penile swelling, scrotal swelling, testicular pain and urgency.  Musculoskeletal: Negative for back pain, neck pain and neck stiffness.  Skin: Negative for pallor.  Neurological: Negative for dizziness, speech difficulty, weakness and headaches.  Psychiatric/Behavioral: Negative for confusion.    Physical Exam Updated Vital Signs BP (!) 136/96 (BP Location: Left Arm)   Pulse 96   Temp 98.6 F (37 C) (Oral)  Resp 20   Ht 5\' 11"  (1.803 m)   Wt 136.1 kg   SpO2 96%   BMI 41.84 kg/m   Physical Exam Exam conducted with a chaperone present.  Constitutional:      General: He is not in acute distress.    Appearance: Normal appearance. He is not ill-appearing, toxic-appearing or diaphoretic.  HENT:     Mouth/Throat:     Mouth: Mucous membranes are moist.     Pharynx: Oropharynx is clear.   Eyes:     General: No scleral icterus.    Extraocular Movements: Extraocular movements intact.     Pupils: Pupils are equal, round, and reactive to light.  Cardiovascular:     Rate and Rhythm: Normal rate and regular rhythm.     Pulses: Normal pulses.     Heart sounds: Normal heart sounds.  Pulmonary:     Effort: Pulmonary effort is normal. No respiratory distress.     Breath sounds: Normal breath sounds. No stridor. No wheezing, rhonchi or rales.  Chest:     Chest wall: No tenderness.  Abdominal:     General: Abdomen is flat. There is no distension.     Palpations: Abdomen is soft.     Tenderness: There is no abdominal tenderness. There is no guarding or rebound.  Genitourinary:    Pubic Area: No rash.      Penis: Uncircumcised. No phimosis, paraphimosis, hypospadias, erythema, tenderness, discharge, swelling or lesions.      Comments: Penis with white exudate under foreskin.  Able to retract foreskin entirely and replace foreskin entirely.  No erythema or induration.  No lesions noticed.  Testes are normal without any masses or tenderness, no swelling. Musculoskeletal:        General: No swelling or tenderness. Normal range of motion.     Cervical back: Normal range of motion and neck supple. No rigidity.     Right lower leg: No edema.     Left lower leg: No edema.  Skin:    General: Skin is warm and dry.     Capillary Refill: Capillary refill takes less than 2 seconds.     Coloration: Skin is not pale.  Neurological:     General: No focal deficit present.     Mental Status: He is alert and oriented to person, place, and time.  Psychiatric:        Mood and Affect: Mood normal.        Behavior: Behavior normal.     ED Results / Procedures / Treatments   Labs (all labs ordered are listed, but only abnormal results are displayed) Labs Reviewed  URINALYSIS, ROUTINE W REFLEX MICROSCOPIC - Abnormal; Notable for the following components:      Result Value   Color, Urine  STRAW (*)    Specific Gravity, Urine 1.031 (*)    Glucose, UA >=500 (*)    Hgb urine dipstick MODERATE (*)    Ketones, ur 80 (*)    Protein, ur 30 (*)    Leukocytes,Ua SMALL (*)    WBC, UA >50 (*)    All other components within normal limits  CBC - Abnormal; Notable for the following components:   RBC 6.39 (*)    MCV 77.6 (*)    MCH 24.6 (*)    All other components within normal limits  COMPREHENSIVE METABOLIC PANEL - Abnormal; Notable for the following components:   Sodium 133 (*)    Chloride 89 (*)    Glucose, Bld  507 (*)    Total Protein 8.5 (*)    ALT 55 (*)    Alkaline Phosphatase 128 (*)    Total Bilirubin 1.3 (*)    Anion gap 20 (*)    All other components within normal limits  BASIC METABOLIC PANEL - Abnormal; Notable for the following components:   Sodium 133 (*)    Chloride 93 (*)    Glucose, Bld 465 (*)    Anion gap 17 (*)    All other components within normal limits  BETA-HYDROXYBUTYRIC ACID - Abnormal; Notable for the following components:   Beta-Hydroxybutyric Acid 4.20 (*)    All other components within normal limits  BLOOD GAS, VENOUS - Abnormal; Notable for the following components:   pCO2, Ven 43.2 (*)    All other components within normal limits  CBG MONITORING, ED - Abnormal; Notable for the following components:   Glucose-Capillary 334 (*)    All other components within normal limits  BASIC METABOLIC PANEL  BASIC METABOLIC PANEL  BASIC METABOLIC PANEL  BETA-HYDROXYBUTYRIC ACID  CYCLIC CITRUL PEPTIDE ANTIBODY, IGG/IGA  C-PEPTIDE  PROINSULIN/INSULIN RATIO  HEMOGLOBIN A1C    EKG None  Radiology No results found.  Procedures Procedures (including critical care time)  Medications Ordered in ED Medications  insulin regular, human (MYXREDLIN) 100 units/ 100 mL infusion (10.5 Units/hr Intravenous New Bag/Given 10/08/19 1315)  0.9 %  sodium chloride infusion ( Intravenous New Bag/Given 10/08/19 1438)  dextrose 5 %-0.45 % sodium chloride infusion  (has no administration in time range)  dextrose 50 % solution 0-50 mL (has no administration in time range)  sodium chloride 0.9 % bolus 1,000 mL (1,000 mLs Intravenous New Bag/Given 10/08/19 1434)  potassium chloride 10 mEq in 100 mL IVPB (10 mEq Intravenous Not Given 10/08/19 1445)  sodium chloride 0.9 % bolus 1,000 mL (has no administration in time range)  sodium chloride 0.9 % bolus 1,000 mL (1,000 mLs Intravenous New Bag/Given 10/08/19 1205)    ED Course  I have reviewed the triage vital signs and the nursing notes.  Pertinent labs & imaging results that were available during my care of the patient were reviewed by me and considered in my medical decision making (see chart for details).  Clinical Course as of Oct 08 1507  Tue Oct 08, 2019  1228 Patient with ED ketones in urine.  High suspicion of diabetes/DKA at this time even though patient has never been diagnosed with diabetes.  Will get CBC and CMP and reassess   [SP]  1232 Patient with glucose of 507 with anion gap of 20.  Patient is most likely in DKA.  Will initiate DKA order set   [SP]    Clinical Course User Index [SP] Farrel Gordon, PA-C   MDM Rules/Calculators/A&P                         AVID GUILLETTE is a 24 y.o. male with pertinent past medical history of obesity that presents the emergency department today for dysuria.  GU exam suggestive of balanitis.  Urine did show 80 ketones, patient is never been diagnosed with diabetes before.  Did obtain CBC and CMP for this reason.  Labs do show that patient is in DKA.  Glucose 507, gap is 20.  Patient is compensating well with CO2 of 24 and no acidosis at this time.  Patient is stable with no nausea, vomiting, chest pain, shortness of breath.  Normal vitals.  However since  patient has never been diagnosed with diabetes before I think that admission is necessary.  Will consult hospitalist at this time.  Urine with small leuks, greater than 50 WBC.  Will order urine culture as well  at this time.  303 discussed case with hospitalist, Dr. Mariea Clonts, who agrees to accept care of patient.  The patient appears reasonably stabilized for admission considering the current resources, flow, and capabilities available in the ED at this time, and I doubt any other [facility] requiring further screening and/or treatment in the ED prior to admission.  Patient will need to be treated for balanits as well.  Final Clinical Impression(s) / ED Diagnoses Final diagnoses:  Balanitis  Diabetic ketoacidosis without coma associated with type 2 diabetes mellitus [facility])    Rx / DC Orders ED Discharge Orders    None       Farrel Gordon, PA-C 10/08/19 1511    Sabas Sous, MD 10/09/19 2037

## 2019-10-08 NOTE — H&P (Signed)
Patient Demographics:    Armas Mcbee, is a 24 y.o. male  MRN: [id number]   DOB - May 04, 1995  Admit Date - 10/08/2019  Outpatient Primary MD for the patient is Patient, No Pcp Per   Assessment & Plan:    Principal Problem:   DKA (diabetic ketoacidoses) (HCC) Active Problems:   Diabetes mellitus, new onset (HCC)    1) new onset diabetes with DKA--  UA reveals glucosuria with ketonuria  glucose is 507  -Anion gap is 20 and bicarb is 24 -Beta hydroxybutyric acid is 4.2 -C -peptide pending --IV insulin and IV fluids Per Endo tool protocol adjusting IV fluid type and rate based on associated lab values -Diabetic educator consult -Given A1c of 13.9 patient will most likely need insulin upon discharge  2) morbid obesity--- BMI 41, this complicates overall care --Lifestyle and dietary medications discussed  3) tobacco abuse--okay cessation advised    Disposition/Need for in-Hospital Stay- patient unable to be discharged at this time due to -new onset diabetes with DKA--requiring IV insulin drip  Status is: Inpatient  Remains inpatient appropriate because:new onset diabetes with DKA--requiring IV insulin drip and IV fluids   Dispo: The patient is from: Home              Anticipated d/c is to: Home              Anticipated d/c date is: 1 day              Patient currently is not medically stable to d/c. Barriers: Not Clinically Stable- new onset diabetes with DKA--requiring IV insulin drip and IV fluids   With History of - Reviewed by me  Past Medical History:  Diagnosis Date  . Allergic rhinitis 07/04/2012  . Obesity, unspecified 07/04/2012      Past Surgical History:  Procedure Laterality Date  . ADENOIDECTOMY        Chief Complaint  Patient presents with  . Dysuria      HPI:     Rylin Seavey  is a 24 y.o. male smoker with past medical history relevant for with obesity and a family history relevant for DM in his mother presents to the ED with concerns about urinary frequency, query dysuria for the last couple of weeks---denies STD exposure -Patient also endorses polyuria, polydipsia, and dry mouth -No headaches, no visual disturbance, no strokelike symptoms,  In ED UA reveals glucosuria with ketonuria -CBC unremarkable except for low MCV and MCH -Sodium is 133 but glucose is 507 so corrected sodium is actually normal, potassium is 4.5 creatinine is 1.0 ALT is 55 with AST of 32 -Anion gap is 20 and bicarb is 24 -A1c is 13.9 -Beta hydroxybutyric acid is 4.2 -C -peptide pending -Patient received his first Covid vaccine awaiting the second dose   Review of systems:    In addition to the HPI above,   A full Review of  Systems was done, all other  systems reviewed are negative except as noted above in HPI , .    Social History:  Reviewed by me    Social History   Tobacco Use  . Smoking status: Never Smoker  . Smokeless tobacco: Never Used  Substance Use Topics  . Alcohol use: No     Family History :  Reviewed by me    Family History  Problem Relation Age of Onset  . Diabetes Mother   . Heart disease Mother   . Kidney disease Mother   . Heart disease Father      Home Medications:   Prior to Admission medications   Medication Sig Start Date End Date Taking? Authorizing Provider  ibuprofen (ADVIL) 200 MG tablet Take 200-400 mg by mouth every 6 (six) hours as needed for fever or moderate pain.   Yes [provider]  benzonatate (TESSALON) 200 MG capsule Take 1 capsule (200 mg total) by mouth 3 (three) times daily as needed for cough. Swallow whole, do not chew Patient not taking: Reported on 10/08/2019 04/02/19   Kem Parkinson, PA-C     Allergies:    No Known Allergies   Physical Exam:   Vitals  Blood pressure (!) 141/89, pulse  93, temperature 98.6 F (37 C), temperature source Oral, resp. rate 17, height 5\' 11"  (1.803 m), weight 136.1 kg, SpO2 99 %.  Physical Examination: General appearance - alert, morbidly obese appearing, and in no distress and  Mental status - alert, oriented to person, place, and time,  Eyes - sclera anicteric Neck - supple, no JVD elevation , Chest - clear  to auscultation bilaterally, symmetrical air movement,  Heart - S1 and S2 normal, regular  Abdomen - soft, nontender, nondistended, increased truncal adiposity  neurological - screening mental status exam normal, neck supple without rigidity, cranial nerves II through XII intact, DTR's normal and symmetric Extremities - no pedal edema noted, intact peripheral pulses  Skin - warm, dry GU-Genitourinary:    Pubic Area: No rash.      Penis: Uncircumcised. No phimosis, paraphimosis, hypospadias, erythema, tenderness, discharge, swelling or lesions.      Comments: Penis with white exudate under foreskin.  Able to retract foreskin entirely and replace foreskin entirely.  No erythema or induration.  No lesions noticed.  Testes are normal without any masses or tenderness, no swelling.     Data Review:    CBC Recent Labs  Lab 10/08/19 1156  WBC 9.9  HGB 15.7  HCT 49.6  PLT 279  MCV 77.6*  MCH 24.6*  MCHC 31.7  RDW 13.2   ------------------------------------------------------------------------------------------------------------------  Chemistries  Recent Labs  Lab 10/08/19 1156 10/08/19 1246 10/08/19 1605  NA 133* 133* 136  K 4.5 4.6 3.8  CL 89* 93* 100  CO2 24 23 21*  GLUCOSE 507* 465* 295*  BUN 16 15 13   CREATININE 1.05 0.98 0.84  CALCIUM 10.2 9.6 9.0  AST 32  --   --   ALT 55*  --   --   ALKPHOS 128*  --   --   BILITOT 1.3*  --   --    ------------------------------------------------------------------------------------------------------------------ estimated creatinine clearance is 189.4 mL/min (by C-G formula  based on SCr of 0.84 mg/dL). ------------------------------------------------------------------------------------------------------------------ No results for input(s): TSH, T4TOTAL, T3FREE, THYROIDAB in the last 72 hours.  Invalid input(s): FREET3   Coagulation profile No results for input(s): INR, PROTIME in the last 168 hours. ------------------------------------------------------------------------------------------------------------------- No results for input(s): DDIMER in the last 72 hours. -------------------------------------------------------------------------------------------------------------------  Cardiac  Enzymes No results for input(s): CKMB, TROPONINI, MYOGLOBIN in the last 168 hours.  Invalid input(s): CK ------------------------------------------------------------------------------------------------------------------ No results found for: BNP   ---------------------------------------------------------------------------------------------------------------  Urinalysis    Component Value Date/Time   COLORURINE STRAW (A) 10/08/2019 1128   APPEARANCEUR CLEAR 10/08/2019 1128   LABSPEC 1.031 (H) 10/08/2019 1128   PHURINE 5.0 10/08/2019 1128   GLUCOSEU >=500 (A) 10/08/2019 1128   HGBUR MODERATE (A) 10/08/2019 1128   BILIRUBINUR NEGATIVE 10/08/2019 1128   KETONESUR 80 (A) 10/08/2019 1128   PROTEINUR 30 (A) 10/08/2019 1128   NITRITE NEGATIVE 10/08/2019 1128   LEUKOCYTESUR SMALL (A) 10/08/2019 1128    ----------------------------------------------------------------------------------------------------------------   Imaging Results:    No results found.  Radiological Exams on Admission: No results found.  DVT Prophylaxis -SCD /heparin AM Labs Ordered, also please review Full Orders  Family Communication: Admission, patients condition and plan of care including tests being ordered have been discussed with the patient   who indicate understanding and agree with  the plan   Code Status - Full Code  Likely DC to home when glycemic control improves Condition- stable,  Shon Hale M.D on 10/08/2019 at 8:10 PM Go to [email or web address] -  for contact info  [facility] - Office  [phone]

## 2019-10-09 DIAGNOSIS — E876 Hypokalemia: Secondary | ICD-10-CM

## 2019-10-09 DIAGNOSIS — I10 Essential (primary) hypertension: Secondary | ICD-10-CM

## 2019-10-09 LAB — CBC
HCT: 42.5 % (ref 39.0–52.0)
Hemoglobin: 13.5 g/dL (ref 13.0–17.0)
MCH: 24.9 pg — ABNORMAL LOW (ref 26.0–34.0)
MCHC: 31.8 g/dL (ref 30.0–36.0)
MCV: 78.3 fL — ABNORMAL LOW (ref 80.0–100.0)
Platelets: 248 10*3/uL (ref 150–400)
RBC: 5.43 MIL/uL (ref 4.22–5.81)
RDW: 13.2 % (ref 11.5–15.5)
WBC: 8.8 10*3/uL (ref 4.0–10.5)
nRBC: 0 % (ref 0.0–0.2)

## 2019-10-09 LAB — BASIC METABOLIC PANEL
Anion gap: 10 (ref 5–15)
Anion gap: 11 (ref 5–15)
Anion gap: 12 (ref 5–15)
BUN: 9 mg/dL (ref 6–20)
BUN: 8 mg/dL (ref 6–20)
BUN: 7 mg/dL (ref 6–20)
CO2: 23 mmol/L (ref 22–32)
CO2: 24 mmol/L (ref 22–32)
CO2: 24 mmol/L (ref 22–32)
Calcium: 8.3 mg/dL — ABNORMAL LOW (ref 8.9–10.3)
Calcium: 8.6 mg/dL — ABNORMAL LOW (ref 8.9–10.3)
Calcium: 8.5 mg/dL — ABNORMAL LOW (ref 8.9–10.3)
Chloride: 103 mmol/L (ref 98–111)
Chloride: 104 mmol/L (ref 98–111)
Chloride: 102 mmol/L (ref 98–111)
Creatinine, Ser: 0.71 mg/dL (ref 0.61–1.24)
Creatinine, Ser: 0.62 mg/dL (ref 0.61–1.24)
Creatinine, Ser: 0.67 mg/dL (ref 0.61–1.24)
GFR calc Af Amer: >60 mL/min (ref >60)
GFR calc Af Amer: >60 mL/min (ref >60)
GFR calc Af Amer: >60 mL/min (ref >60)
GFR calc non Af Amer: >60 mL/min (ref >60)
GFR calc non Af Amer: >60 mL/min (ref >60)
GFR calc non Af Amer: >60 mL/min (ref >60)
Glucose, Bld: 188 mg/dL — ABNORMAL HIGH (ref 70–99)
Glucose, Bld: 153 mg/dL — ABNORMAL HIGH (ref 70–99)
Glucose, Bld: 149 mg/dL — ABNORMAL HIGH (ref 70–99)
Potassium: 3 mmol/L — ABNORMAL LOW (ref 3.5–5.1)
Potassium: 3 mmol/L — ABNORMAL LOW (ref 3.5–5.1)
Potassium: 3.4 mmol/L — ABNORMAL LOW (ref 3.5–5.1)
Sodium: 136 mmol/L (ref 135–145)
Sodium: 139 mmol/L (ref 135–145)
Sodium: 138 mmol/L (ref 135–145)

## 2019-10-09 LAB — COMPREHENSIVE METABOLIC PANEL
ALT: 55 U/L — ABNORMAL HIGH (ref 0–44)
AST: 60 U/L — ABNORMAL HIGH (ref 15–41)
Albumin: 3.3 g/dL — ABNORMAL LOW (ref 3.5–5.0)
Alkaline Phosphatase: 88 U/L (ref 38–126)
Anion gap: 11 (ref 5–15)
BUN: 8 mg/dL (ref 6–20)
CO2: 23 mmol/L (ref 22–32)
Calcium: 8.4 mg/dL — ABNORMAL LOW (ref 8.9–10.3)
Chloride: 102 mmol/L (ref 98–111)
Creatinine, Ser: 0.62 mg/dL (ref 0.61–1.24)
GFR calc Af Amer: >60 mL/min (ref >60)
GFR calc non Af Amer: >60 mL/min (ref >60)
Glucose, Bld: 156 mg/dL — ABNORMAL HIGH (ref 70–99)
Potassium: 2.9 mmol/L — ABNORMAL LOW (ref 3.5–5.1)
Sodium: 136 mmol/L (ref 135–145)
Total Bilirubin: 0.9 mg/dL (ref 0.3–1.2)
Total Protein: 6.6 g/dL (ref 6.5–8.1)

## 2019-10-09 LAB — GLUCOSE, CAPILLARY
Glucose-Capillary: 142 mg/dL — ABNORMAL HIGH (ref 70–99)
Glucose-Capillary: 145 mg/dL — ABNORMAL HIGH (ref 70–99)
Glucose-Capillary: 153 mg/dL — ABNORMAL HIGH (ref 70–99)
Glucose-Capillary: 155 mg/dL — ABNORMAL HIGH (ref 70–99)
Glucose-Capillary: 156 mg/dL — ABNORMAL HIGH (ref 70–99)
Glucose-Capillary: 156 mg/dL — ABNORMAL HIGH (ref 70–99)
Glucose-Capillary: 160 mg/dL — ABNORMAL HIGH (ref 70–99)
Glucose-Capillary: 161 mg/dL — ABNORMAL HIGH (ref 70–99)
Glucose-Capillary: 177 mg/dL — ABNORMAL HIGH (ref 70–99)
Glucose-Capillary: 185 mg/dL — ABNORMAL HIGH (ref 70–99)
Glucose-Capillary: 185 mg/dL — ABNORMAL HIGH (ref 70–99)
Glucose-Capillary: 218 mg/dL — ABNORMAL HIGH (ref 70–99)
Glucose-Capillary: 224 mg/dL — ABNORMAL HIGH (ref 70–99)
Glucose-Capillary: 315 mg/dL — ABNORMAL HIGH (ref 70–99)

## 2019-10-09 LAB — MAGNESIUM
Magnesium: 1.6 mg/dL — ABNORMAL LOW (ref 1.7–2.4)
Magnesium: 1.8 mg/dL (ref 1.7–2.4)

## 2019-10-09 LAB — MRSA PCR SCREENING: MRSA by PCR: NEGATIVE

## 2019-10-09 LAB — HIV ANTIBODY (ROUTINE TESTING W REFLEX): HIV Screen 4th Generation wRfx: NONREACTIVE

## 2019-10-09 LAB — BETA-HYDROXYBUTYRIC ACID: Beta-Hydroxybutyric Acid: 0.89 mmol/L — ABNORMAL HIGH (ref 0.05–0.27)

## 2019-10-09 LAB — VITAMIN D 25 HYDROXY (VIT D DEFICIENCY, FRACTURES): Vit D, 25-Hydroxy: 10.86 ng/mL — ABNORMAL LOW (ref 30–100)

## 2019-10-09 LAB — PHOSPHORUS: Phosphorus: 2.7 mg/dL (ref 2.5–4.6)

## 2019-10-09 MED ORDER — POTASSIUM CHLORIDE 10 MEQ/100ML IV SOLN
10.0000 meq | INTRAVENOUS | Status: AC
  Administered 2019-10-09 (×3): 10 meq via INTRAVENOUS
  Filled 2019-10-09 (×3): qty 100

## 2019-10-09 MED ORDER — INSULIN ASPART 100 UNIT/ML ~~LOC~~ SOLN
0.0000 [IU] | Freq: Every day | SUBCUTANEOUS | Status: DC
  Administered 2019-10-09: 2 [IU] via SUBCUTANEOUS

## 2019-10-09 MED ORDER — LIVING WELL WITH DIABETES BOOK: Freq: Once | Status: AC

## 2019-10-09 MED ORDER — CHLORHEXIDINE GLUCONATE CLOTH 2 % EX PADS
6.0000 | MEDICATED_PAD | Freq: Every day | CUTANEOUS | Status: DC
  Administered 2019-10-09 – 2019-10-10 (×2): 6 via TOPICAL

## 2019-10-09 MED ORDER — POTASSIUM CHLORIDE IN NACL 20-0.9 MEQ/L-% IV SOLN: INTRAVENOUS | Status: DC

## 2019-10-09 MED ORDER — LOSARTAN POTASSIUM 50 MG PO TABS
50.0000 mg | ORAL_TABLET | Freq: Every day | ORAL | Status: DC
  Administered 2019-10-09: 50 mg via ORAL
  Filled 2019-10-09: qty 1

## 2019-10-09 MED ORDER — INSULIN ASPART 100 UNIT/ML ~~LOC~~ SOLN
0.0000 [IU] | Freq: Three times a day (TID) | SUBCUTANEOUS | Status: DC
  Administered 2019-10-09: 15 [IU] via SUBCUTANEOUS
  Administered 2019-10-09 – 2019-10-10 (×2): 4 [IU] via SUBCUTANEOUS
  Administered 2019-10-10: 11 [IU] via SUBCUTANEOUS

## 2019-10-09 MED ORDER — MAGNESIUM SULFATE 2 GM/50ML IV SOLN
2.0000 g | Freq: Once | INTRAVENOUS | Status: AC
  Administered 2019-10-09: 2 g via INTRAVENOUS
  Filled 2019-10-09: qty 50

## 2019-10-09 MED ORDER — METFORMIN HCL ER 500 MG PO TB24
500.0000 mg | ORAL_TABLET | Freq: Every day | ORAL | Status: DC
  Administered 2019-10-09: 500 mg via ORAL
  Filled 2019-10-09: qty 1

## 2019-10-09 MED ORDER — AMLODIPINE BESYLATE 5 MG PO TABS
5.0000 mg | ORAL_TABLET | Freq: Every day | ORAL | Status: DC
  Administered 2019-10-09 – 2019-10-10 (×2): 5 mg via ORAL
  Filled 2019-10-09 (×2): qty 1

## 2019-10-09 MED ORDER — POTASSIUM CHLORIDE CRYS ER 20 MEQ PO TBCR
20.0000 meq | EXTENDED_RELEASE_TABLET | Freq: Once | ORAL | Status: AC
  Administered 2019-10-09: 20 meq via ORAL
  Filled 2019-10-09: qty 1

## 2019-10-09 MED ORDER — INSULIN ASPART 100 UNIT/ML ~~LOC~~ SOLN
30.0000 [IU] | Freq: Three times a day (TID) | SUBCUTANEOUS | Status: DC
  Administered 2019-10-09 (×2): 30 [IU] via SUBCUTANEOUS

## 2019-10-09 MED ORDER — INSULIN GLARGINE 100 UNIT/ML ~~LOC~~ SOLN
90.0000 [IU] | Freq: Every day | SUBCUTANEOUS | Status: DC
  Administered 2019-10-09: 90 [IU] via SUBCUTANEOUS
  Filled 2019-10-09 (×2): qty 0.9

## 2019-10-09 NOTE — Progress Notes (Signed)
PROGRESS NOTE   Stephen Silva  TDD:[id number] DOB: 01-07-1996 DOA: 10/08/2019 PCP: Patient, No Pcp Per   Chief Complaint  Patient presents with  . Dysuria    Brief Admission History:  24 y.o. male smoker with past medical history relevant for with obesity and a family history relevant for DM in his mother presents to the ED with concerns about urinary frequency, query dysuria for the last couple of weeks.  He was admitted with DKA from newly discovered type 2 diabetes mellitus.    Assessment & Plan:   Principal Problem:   DKA (diabetic ketoacidoses) (HCC) Active Problems:   Diabetes mellitus, new onset (HCC)   Hypokalemia    1. DKA - Acidosis has resolved and patient is wanting to eat.  He has been transitioned to basal bolus insulin this morning.  Lantus 90 units plus Novolog 30 units TIDAC plus SSI coverage and starting metformin XR 500 mg daily with supper with plans to titrate over next several days to goal of 2000 mg BID with meals.  Continue diabetes education.  TOC team arranging for PCP.   2. Hypertension - on no meds, added losartan 50 mg daily.   3. Hypokalemia - continue to replace and follow magnesium, replete as needed.  4. Tobacco - counseled on cessation.   DVT prophylaxis:  SCD/heparin  Code Status:  Full  Family Communication: father/wife at bedside  Disposition:   Status is: Inpatient  Remains inpatient appropriate because:IV treatments appropriate due to intensity of illness or inability to take PO   Dispo: The patient is from: Home              Anticipated d/c is to: Home              Anticipated d/c date is: 1 day              Patient currently is not medically stable to d/c.   Consultants:     Procedures:     Antimicrobials:     Subjective: Pt without complaints.  He is open to learning about insulin self administration and glucose testing.     Objective: Vitals:   10/09/19 0724 10/09/19 0800 10/09/19 0842 10/09/19 1118  BP:   (!)  168/97   Pulse: 80 72  93  Resp: (!) 29 17  (!) 25  Temp: 98.1 F (36.7 C)   98.1 F (36.7 C)  TempSrc: Oral   Oral  SpO2: 100% 97%  98%  Weight:      Height:        Intake/Output Summary (Last 24 hours) at 10/09/2019 1147 Last data filed at 10/09/2019 0700 Gross per 24 hour  Intake 1408.37 ml  Output --  Net 1408.37 ml   Filed Weights   10/08/19 1029 10/08/19 2251 10/09/19 0500  Weight: 136.1 kg (!) 160.1 kg (!) 160.1 kg    Examination:  General exam: Appears calm and comfortable  Respiratory system: Clear to auscultation. Respiratory effort normal. Cardiovascular system: S1 & S2 heard, RRR. No JVD, murmurs, rubs, gallops or clicks. No pedal edema. Gastrointestinal system: Abdomen is nondistended, soft and nontender. No organomegaly or masses felt. Normal bowel sounds heard. Central nervous system: Alert and oriented. No focal neurological deficits. Extremities: Symmetric 5 x 5 power. Skin: No rashes, lesions or ulcers Psychiatry: Judgement and insight appear normal. Mood & affect appropriate.   Data Reviewed: I have personally reviewed following labs and imaging studies  CBC: Recent Labs  Lab 10/08/19 1156 10/09/19  0414  WBC 9.9 8.8  HGB 15.7 13.5  HCT 49.6 42.5  MCV 77.6* 78.3*  PLT 279 248    Basic Metabolic Panel: Recent Labs  Lab 10/08/19 1605 10/08/19 2010 10/09/19 0039 10/09/19 0414 10/09/19 0816  NA 136 137 136 136  139 138  K 3.8 3.7 3.0* 2.9*  3.0* 3.4*  CL 100 101 103 102  104 102  CO2 21* 25 23 23  24 24   GLUCOSE 295* 250* 188* 156*  153* 149*  BUN 13 10 9 8  8 7   CREATININE 0.84 0.76 0.71 0.62  0.62 0.67  CALCIUM 9.0 8.4* 8.3* 8.4*  8.6* 8.5*  MG  --   --  1.6*  --  1.8  PHOS  --   --  2.7  --   --     GFR: Estimated Creatinine Clearance: 215.2 mL/min (by C-G formula based on SCr of 0.67 mg/dL).  Liver Function Tests: Recent Labs  Lab 10/08/19 1156 10/09/19 0414  AST 32 60*  ALT 55* 55*  ALKPHOS 128* 88  BILITOT 1.3*  0.9  PROT 8.5* 6.6  ALBUMIN 4.4 3.3*    CBG: Recent Labs  Lab 10/09/19 0725 10/09/19 0846 10/09/19 0950 10/09/19 1056 10/09/19 1145  GLUCAP 155* 142* 224* 185* 177*    Recent Results (from the past 240 hour(s))  SARS Coronavirus 2 by RT PCR (hospital order, performed in [facility] hospital lab) Nasopharyngeal Nasopharyngeal Swab     Status: None   Collection Time: 10/08/19  4:01 PM   Specimen: Nasopharyngeal Swab  Result Value Ref Range Status   SARS Coronavirus 2 NEGATIVE NEGATIVE Final    Comment: (NOTE) SARS-CoV-2 target nucleic acids are NOT DETECTED.  The SARS-CoV-2 RNA is generally detectable in upper and lower respiratory specimens during the acute phase of infection. The lowest concentration of SARS-CoV-2 viral copies this assay can detect is 250 copies / mL. A negative result does not preclude SARS-CoV-2 infection and should not be used as the sole basis for treatment or other patient management decisions.  A negative result may occur with improper specimen collection / handling, submission of specimen other than nasopharyngeal swab, presence of viral mutation(s) within the areas targeted by this assay, and inadequate number of viral copies (<250 copies / mL). A negative result must be combined with clinical observations, patient history, and epidemiological information.  Fact Sheet for Patients:   [facility]  Fact Sheet for Healthcare Providers: 10/10/19  This test is not yet approved or  cleared by the [employer] FDA and has been authorized for detection and/or diagnosis of SARS-CoV-2 by FDA under an Emergency Use Authorization (EUA).  This EUA will remain in effect (meaning this test can be used) for the duration of the COVID-19 declaration under Section 564(b)(1) of the Act, 21 U.S.C. section 360bbb-3(b)(1), unless the authorization is terminated or revoked sooner.  Performed at [facility], [address] [place], [address] [place]   MRSA PCR Screening     Status: None   Collection Time: 10/08/19 11:36 PM   Specimen: Nasal Mucosa; Nasopharyngeal  Result Value Ref Range Status   MRSA by PCR NEGATIVE NEGATIVE Final    Comment:        The GeneXpert MRSA Assay (FDA approved for NASAL specimens only), is one component of a comprehensive MRSA colonization surveillance program. It is not intended to diagnose MRSA infection nor to guide or monitor treatment for MRSA infections. Performed at [facility], [address],  [place], [place] [address]      Radiology Studies: No results found.   Scheduled Meds: . Chlorhexidine Gluconate Cloth  6 each Topical Daily  . heparin  5,000 Units Subcutaneous Q8H  . insulin aspart  0-20 Units Subcutaneous TID WC  . insulin aspart  0-5 Units Subcutaneous QHS  . insulin aspart  30 Units Subcutaneous TID WC  . insulin glargine  90 Units Subcutaneous Daily  . losartan  50 mg Oral Daily  . metFORMIN  500 mg Oral Q supper  . sodium chloride flush  3 mL Intravenous Q12H   Continuous Infusions: . sodium chloride    . 0.9 % NaCl with KCl 20 mEq / L 75 mL/hr at 10/09/19 0831  . insulin Stopped (10/09/19 1140)     LOS: 1 day   Critical Care Procedure Note Authorized and Performed by: Murvin Natal MD  Total Critical Care time:  34 minutes  Due to a high probability of clinically significant, life threatening deterioration, the patient required my highest level of preparedness to intervene emergently and I personally spent this critical care time directly and personally managing the patient.  This critical care time included obtaining a history; examining the patient, pulse oximetry; ordering and review of studies; arranging urgent treatment with development of a management plan; evaluation of patient's response of treatment; frequent reassessment; and discussions with other providers.  This critical care time was performed to assess  and manage the high probability of imminent and life threatening deterioration that could result in multi-organ failure.  It was exclusive of separately billable procedures and treating other patients and teaching time.   Stephen Brakeman, MD How to contact the [facility] Attending or Consulting provider [place] or covering provider during after hours [place], for this patient?  1. Check the care team in [facility] and look for a) attending/consulting TRH provider listed and b) the [facility] team listed 2. Log into [email or web address] and use [place]'s universal password to access. If you do not have the password, please contact the hospital operator. 3. Locate the [facility] provider you are looking for under [facility] and page to a number that you can be directly reached. 4. If you still have difficulty reaching the provider, please page the [facility] (Director on Call) for the Hospitalists listed on amion for assistance.  10/09/2019, 11:47 AM

## 2019-10-09 NOTE — TOC Initial Note (Signed)
Transition of Care (TOC) - Initial/Assessment Note    Patient Details  Name: Stephen Silva MRN: [id number] Date of Birth: 04/14/1996  Transition of Care (TOC) CM/SW Contact:    Stultz, Kara Shanaberger, LCSW Phone Number: 10/09/2019, 9:36 AM  Clinical Narrative:   LCSW met with pt and pt's wife at bedside. Pt admitted with new onset diabetes with DKA. He reports that his mother and grandmother died this past year, so he is not working as he is helping to care for his dad. However, he is looking for employment. Pt's wife states that she completed Medicaid application for pt and she is also considering adding him to her insurance. LCSW discussed [facility] referral for PCP. Pt agreeable. LCSW made referral. [facility] also states they can assist with insulin through their medication/diabetes programs. LCSW requested that [facility] discuss further with pt. Will continue to follow for any d/c planning needs.              Expected Discharge Plan: Home/Self Care Barriers to Discharge: Inadequate or no insurance, Continued Medical Work up   Patient Goals and CMS Choice Patient states their goals for this hospitalization and ongoing recovery are:: return home      Expected Discharge Plan and Services Expected Discharge Plan: Home/Self Care In-house Referral: Clinical Social Work     Living arrangements for the past 2 months: Single Family Home                                      Prior Living Arrangements/Services Living arrangements for the past 2 months: Single Family Home Lives with:: Spouse Patient language and need for interpreter reviewed:: Yes Do you feel safe going back to the place where you live?: Yes      Need for Family Participation in Patient Care: No (Comment) Care giver support system in place?: Yes (comment)   Criminal Activity/Legal Involvement Pertinent to Current Situation/Hospitalization: No - Comment as needed  Activities of Daily Living Home  Assistive Devices/Equipment: None ADL Screening (condition at time of admission) Patient's cognitive ability adequate to safely complete daily activities?: Yes Is the patient deaf or have difficulty hearing?: No Does the patient have difficulty seeing, even when wearing glasses/contacts?: No Does the patient have difficulty concentrating, remembering, or making decisions?: No Patient able to express need for assistance with ADLs?: Yes Does the patient have difficulty dressing or bathing?: No Independently performs ADLs?: Yes (appropriate for developmental age) Does the patient have difficulty walking or climbing stairs?: No Weakness of Legs: None Weakness of Arms/Hands: None  Permission Sought/Granted                  Emotional Assessment Appearance:: Appears stated age Attitude/Demeanor/Rapport: Engaged Affect (typically observed): Accepting Orientation: : Oriented to Self, Oriented to Place, Oriented to  Time, Oriented to Situation Alcohol / Substance Use: Not Applicable Psych Involvement: No (comment)  Admission diagnosis:  Balanitis [N48.1] DKA (diabetic ketoacidoses) (HCC) [E11.10] Diabetic ketoacidosis without coma associated with type 2 diabetes mellitus (HCC) [E11.10] Patient Active Problem List   Diagnosis Date Noted  . Hypokalemia 10/09/2019  . DKA (diabetic ketoacidoses) (HCC) 10/08/2019  . Diabetes mellitus, new onset (HCC) 10/08/2019  . Obesity, unspecified 07/04/2012  . Allergic rhinitis 07/04/2012   PCP:  Patient, No Pcp Per Pharmacy:   [facility] - [place], [place] - [address] [place] [address] Phone: [phone] Fax:   [phone]  [id number] - [place], [place] - [address] AT [facility] [address] [place] [address] Phone: [phone] Fax: [phone]     Social Determinants of Health (SDOH) Interventions    Readmission Risk Interventions No flowsheet data found.  

## 2019-10-09 NOTE — Progress Notes (Addendum)
Diabetes Coordinator not on [facility] today.  Called Pt and his wife by phone and was placed on speaker phone so both pt and wife could hear.  Spoke with pt and wife about new diagnosis.  Discussed A1C results with them and explained what an A1C is, basic pathophysiology of DM Type 2, basic home care, basic diabetes diet nutrition principles, importance of checking CBGs and maintaining good CBG control to prevent long-term and short-term complications.  Reviewed signs and symptoms of hyperglycemia and hypoglycemia and how to treat hypoglycemia at home.  Also reviewed blood sugar goals and A1c goals for home.    Also discussed with patient diagnosis of DKA (pathophysiology), treatment of DKA, lab results, and transition plan to SQ insulin regimen.  Discussed w/ pt and wife what Lantus and Novolog inulins are, how they work, when to take, Social research officer, government.  RNs to provide ongoing basic DM education at bedside with this patient.  Have ordered educational booklet, insulin starter kit.  Have also placed RD consult for DM diet education for this patient.  Also emailed pt several videos on Insulin pen admin and basic diabetes education that can be accessed through You [email or web address]   --Will follow patient during hospitalization--  Wyn Quaker RN, MSN, CDE Diabetes Coordinator Inpatient Glycemic Control Team Team Pager: [phone] (8a-5p)

## 2019-10-09 NOTE — Progress Notes (Addendum)
Inpatient Diabetes Program Recommendations  AACE/ADA: New Consensus Statement on Inpatient Glycemic Control (2015)  Target Ranges:  Prepandial:   less than 140 mg/dL      Peak postprandial:   less than 180 mg/dL (1-2 hours)      Critically ill patients:  140 - 180 mg/dL   Results for Stephen Silva, Stephen Silva (MRN [id number]) as of 10/09/2019 07:10  Ref. Range 10/09/2019 00:38 10/09/2019 01:34 10/09/2019 02:33 10/09/2019 03:29 10/09/2019 04:30 10/09/2019 05:29 10/09/2019 06:35  Glucose-Capillary Latest Ref Range: 70 - 99 mg/dL 474 (H)  IV Insulin Drip 153 (H)  IV Insulin Drip 156 (H)  IV Insulin Drip 161 (H)  IV Insulin Drip 160 (H)  IV Insulin Drip 145 (H)  IV Insulin Drip 156 (H)  IV Insulin Drip    Admit with: DKA/ New Diagnosis of Diabetes   Current Orders: IV Insulin Drip     4:14am BMET shows the following: Glucose 156 mg/dl Anion Gap= 11 CO2 level= 23   MD- Note BMET shows improvement this AM.  IV Insulin Drip rates are fairly high since Midnight.  Averaging around 7 units IV Insulin per hour.  May need fairly large dose of Lantus to transition to SQ Insulin when ready.  Could try Lantus 55 units Daily (0.35 units/kg based on weight of 160 kg)--Make sure to give Lantus at least 1-2 hours prior to d/c of the IV Insulin drip  Would also start Novolog Resistant Correction Scale/ SSI (0-20 units) TID AC + HS  And Novolog 6 units TID with meals for Meal Coverage  Diabetes Coordinator not present on campus today but will call patient by phone to discuss new diagnosis and will ask RNs to begin insulin instruction.  Of note, patient does not have insurance.  Will either need assistance with insulin or may have to consider sending patient home on either NPH + Regular or 70/30 Insulin from [facility].      --Will follow patient during hospitalization--  Ambrose Finland RN, MSN, CDE Diabetes Coordinator Inpatient Glycemic Control Team Team Pager: [phone]  (8a-5p)

## 2019-10-09 NOTE — Plan of Care (Signed)
  RD consulted for nutrition education regarding diabetes.   Lab Results  Component Value Date   HGBA1C 13.9 (H) 10/08/2019    RD provided "Carbohydrate Counting for People with Diabetes" handout from the Academy of Nutrition and Dietetics. Discussed different food groups and their effects on blood sugar, emphasizing carbohydrate-containing foods. Provided list of carbohydrates and recommended serving sizes of common foods.  Discussed importance of controlled and consistent carbohydrate intake throughout the day. Provided examples of ways to balance meals/snacks and encouraged intake of high-fiber, whole grain complex carbohydrates. Teach back method used.  Expect fair compliance.  Body mass index is 50.64 kg/m. Pt meets criteria for morbid obesity based on current BMI.  Current diet order is CM, patient is consuming approximately 100% of meals at this time. Labs and medications reviewed. No further nutrition interventions warranted at this time. RD contact information provided. If additional nutrition issues arise, please re-consult RD.  Lars Masson, RD, LDN Clinical Nutrition After Hours/Weekend Pager # in Amion

## 2019-10-09 NOTE — Progress Notes (Signed)
TRH night shift.  The patient was admitted yesterday evening with DKA.  He is currently on an insulin infusion.  His most recent potassium level came back at 3.0 mmol/L.  K-Dur 20 mEq p.o. x1 and KCl 10 mEq IVPB x 3 ordered.  Magnesium and phosphorus to be obtained on next blood draw.  Sanda Klein, MD

## 2019-10-10 DIAGNOSIS — E559 Vitamin D deficiency, unspecified: Secondary | ICD-10-CM

## 2019-10-10 LAB — CBC WITH DIFFERENTIAL/PLATELET
Abs Immature Granulocytes: 0.04 10*3/uL (ref 0.00–0.07)
Basophils Absolute: 0 10*3/uL (ref 0.0–0.1)
Basophils Relative: 1 %
Eosinophils Absolute: 0.1 10*3/uL (ref 0.0–0.5)
Eosinophils Relative: 1 %
HCT: 41.8 % (ref 39.0–52.0)
Hemoglobin: 13.1 g/dL (ref 13.0–17.0)
Immature Granulocytes: 1 %
Lymphocytes Relative: 46 %
Lymphs Abs: 3.6 10*3/uL (ref 0.7–4.0)
MCH: 25.2 pg — ABNORMAL LOW (ref 26.0–34.0)
MCHC: 31.3 g/dL (ref 30.0–36.0)
MCV: 80.4 fL (ref 80.0–100.0)
Monocytes Absolute: 0.5 10*3/uL (ref 0.1–1.0)
Monocytes Relative: 7 %
Neutro Abs: 3.3 10*3/uL (ref 1.7–7.7)
Neutrophils Relative %: 44 %
Platelets: 219 10*3/uL (ref 150–400)
RBC: 5.2 MIL/uL (ref 4.22–5.81)
RDW: 13.6 % (ref 11.5–15.5)
WBC: 7.5 10*3/uL (ref 4.0–10.5)
nRBC: 0 % (ref 0.0–0.2)

## 2019-10-10 LAB — GLUCOSE, CAPILLARY
Glucose-Capillary: 238 mg/dL — ABNORMAL HIGH (ref 70–99)
Glucose-Capillary: 272 mg/dL — ABNORMAL HIGH (ref 70–99)
Glucose-Capillary: 197 mg/dL — ABNORMAL HIGH (ref 70–99)

## 2019-10-10 LAB — BASIC METABOLIC PANEL
Anion gap: 10 (ref 5–15)
BUN: 6 mg/dL (ref 6–20)
CO2: 22 mmol/L (ref 22–32)
Calcium: 7.9 mg/dL — ABNORMAL LOW (ref 8.9–10.3)
Chloride: 103 mmol/L (ref 98–111)
Creatinine, Ser: 0.63 mg/dL (ref 0.61–1.24)
GFR calc Af Amer: >60 mL/min (ref >60)
GFR calc non Af Amer: >60 mL/min (ref >60)
Glucose, Bld: 259 mg/dL — ABNORMAL HIGH (ref 70–99)
Potassium: 3.5 mmol/L (ref 3.5–5.1)
Sodium: 135 mmol/L (ref 135–145)

## 2019-10-10 LAB — URINE CULTURE: Culture: <10000 — AB

## 2019-10-10 LAB — TSH: TSH: 1.575 u[IU]/mL (ref 0.350–4.500)

## 2019-10-10 LAB — MAGNESIUM: Magnesium: 1.9 mg/dL (ref 1.7–2.4)

## 2019-10-10 MED ORDER — METOPROLOL TARTRATE 25 MG PO TABS: 25.0000 mg | ORAL_TABLET | Freq: Two times a day (BID) | ORAL | 1 refills | Status: DC

## 2019-10-10 MED ORDER — BLOOD GLUCOSE METER KIT: PACK | 5 refills | Status: AC

## 2019-10-10 MED ORDER — VITAMIN D (ERGOCALCIFEROL) 1.25 MG (50000 UNIT) PO CAPS
50000.0000 [IU] | ORAL_CAPSULE | ORAL | Status: DC
  Administered 2019-10-10: 50000 [IU] via ORAL
  Filled 2019-10-10: qty 1

## 2019-10-10 MED ORDER — LOSARTAN POTASSIUM 50 MG PO TABS
50.0000 mg | ORAL_TABLET | Freq: Every day | ORAL | Status: DC
  Administered 2019-10-10: 50 mg via ORAL
  Filled 2019-10-10: qty 1

## 2019-10-10 MED ORDER — FLUCONAZOLE 200 MG PO TABS: 200.0000 mg | ORAL_TABLET | Freq: Every day | ORAL | 0 refills | Status: AC

## 2019-10-10 MED ORDER — POTASSIUM CHLORIDE CRYS ER 20 MEQ PO TBCR
40.0000 meq | EXTENDED_RELEASE_TABLET | Freq: Once | ORAL | Status: AC
  Administered 2019-10-10: 40 meq via ORAL
  Filled 2019-10-10: qty 2

## 2019-10-10 MED ORDER — INSULIN LISPRO (1 UNIT DIAL) 100 UNIT/ML (KWIKPEN)
30.0000 [IU] | PEN_INJECTOR | Freq: Three times a day (TID) | SUBCUTANEOUS | 3 refills | Status: DC
Start: 2019-10-10 — End: 2019-10-10

## 2019-10-10 MED ORDER — BASAGLAR KWIKPEN 100 UNIT/ML ~~LOC~~ SOPN: 90.0000 [IU] | PEN_INJECTOR | Freq: Every day | SUBCUTANEOUS | 1 refills | Status: DC

## 2019-10-10 MED ORDER — INSULIN ASPART 100 UNIT/ML ~~LOC~~ SOLN
36.0000 [IU] | Freq: Three times a day (TID) | SUBCUTANEOUS | Status: DC
  Administered 2019-10-10 (×2): 36 [IU] via SUBCUTANEOUS

## 2019-10-10 MED ORDER — METFORMIN HCL ER 500 MG PO TB24: ORAL_TABLET | ORAL | 1 refills | Status: DC

## 2019-10-10 MED ORDER — PEN NEEDLES 31G X 8 MM MISC: 1.0000 | 1 refills | Status: DC

## 2019-10-10 MED ORDER — LOSARTAN POTASSIUM 50 MG PO TABS: 50.0000 mg | ORAL_TABLET | Freq: Every day | ORAL | 1 refills | Status: DC

## 2019-10-10 MED ORDER — INSULIN GLARGINE 100 UNIT/ML ~~LOC~~ SOLN
100.0000 [IU] | Freq: Every day | SUBCUTANEOUS | Status: DC
  Administered 2019-10-10: 100 [IU] via SUBCUTANEOUS
  Filled 2019-10-10 (×2): qty 1

## 2019-10-10 MED ORDER — KETOCONAZOLE 2 % EX CREA
TOPICAL_CREAM | Freq: Two times a day (BID) | CUTANEOUS | Status: DC
  Filled 2019-10-10: qty 15

## 2019-10-10 MED ORDER — VITAMIN D (ERGOCALCIFEROL) 1.25 MG (50000 UNIT) PO CAPS: 50000.0000 [IU] | ORAL_CAPSULE | ORAL | 0 refills | Status: DC

## 2019-10-10 MED ORDER — KETOCONAZOLE 2 % EX CREA: TOPICAL_CREAM | Freq: Two times a day (BID) | CUTANEOUS | 1 refills | Status: DC

## 2019-10-10 MED ORDER — FLUCONAZOLE 100 MG PO TABS
200.0000 mg | ORAL_TABLET | Freq: Every day | ORAL | Status: DC
  Administered 2019-10-10: 200 mg via ORAL
  Filled 2019-10-10: qty 2

## 2019-10-10 MED ORDER — METOPROLOL TARTRATE 25 MG PO TABS
25.0000 mg | ORAL_TABLET | Freq: Two times a day (BID) | ORAL | Status: DC
  Administered 2019-10-10: 25 mg via ORAL
  Filled 2019-10-10: qty 1

## 2019-10-10 MED ORDER — AMLODIPINE BESYLATE 5 MG PO TABS: 5.0000 mg | ORAL_TABLET | Freq: Every day | ORAL | 1 refills | Status: DC

## 2019-10-10 MED ORDER — INSULIN LISPRO (1 UNIT DIAL) 100 UNIT/ML (KWIKPEN)
30.0000 [IU] | PEN_INJECTOR | Freq: Three times a day (TID) | SUBCUTANEOUS | 3 refills | Status: DC
Start: 2019-10-10 — End: 2019-10-31

## 2019-10-10 MED ORDER — LOSARTAN POTASSIUM 50 MG PO TABS: 100.0000 mg | ORAL_TABLET | Freq: Every day | ORAL | Status: DC

## 2019-10-10 MED FILL — UNIFINE PENTIPS 8MM 31G: 31G X 8 MM | 25 days supply | Qty: 100 | Fill #0

## 2019-10-10 MED FILL — HUMALOG 100 UNITS/ML KWIKPE: 100 | 17 days supply | Qty: 15 | Fill #0

## 2019-10-10 MED FILL — BASAGLAR 100 UNIT/ML KWIKPE: 100 | 30 days supply | Qty: 27 | Fill #0

## 2019-10-10 NOTE — TOC Transition Note (Signed)
Transition of Care [facility]) - CM/SW Discharge Note   Patient Details  Name: Stephen Silva MRN: [id number] Date of Birth: [birth year]  Transition of Care [facility]) CM/SW Contact:  Karn Cassis, LCSW Phone Number: 10/10/2019, 11:47 AM   Clinical Narrative:  Pt d/c home today. LCSW spoke with Elease Hashimoto at [facility] who reports they can provide glucometer and 50 testing strips to pt. Elease Hashimoto states they complete application with [facility] for medications and said that Metformin, Basaglar, and Humalog are covered. MATCH voucher completed for pt to get insulin until [facility] application is completed. LCSW notified pt to contact [facility] for glucometer. MATCH voucher provided to pt's wife.  [facility].      Final next level of care: Home/Self Care Barriers to Discharge: Barriers Resolved   Patient Goals and CMS Choice Patient states their goals for this hospitalization and ongoing recovery are:: return home      Discharge Placement                    Patient and family notified of of transfer: 10/10/19  Discharge Plan and Services In-house Referral: Clinical Social Work                                   Social Determinants of Health (SDOH) Interventions     Readmission Risk Interventions No flowsheet data found.

## 2019-10-10 NOTE — Discharge Summary (Signed)
Physician Discharge Summary  Stephen Silva EXH:[id number] DOB: Jan 06, 1996 DOA: 10/08/2019  PCP: establishing with [facility]  Admit date: 10/08/2019 Discharge date: 10/10/2019  Admitted From:  Home  Disposition: Home   Recommendations for Outpatient Follow-up:  1. Follow up with PCP in 1 weeks 2. Please obtain BMP/CBC in 1-2 weeks 3. Please check fasting lipid panel in 1 month  Discharge Condition: STABLE   CODE STATUS: FULL    Brief Hospitalization Summary: Please see all hospital notes, images, labs for full details of the hospitalization. ADMISSION HPI:  Stephen Silva  is a 24 y.o. male smoker with past medical history relevant for with obesity and a family history relevant for DM in his mother presents to the ED with concerns about urinary frequency, query dysuria for the last couple of weeks---denies STD exposure -Patient also endorses polyuria, polydipsia, and dry mouth -No headaches, no visual disturbance, no strokelike symptoms, In ED UA reveals glucosuria with ketonuria -CBC unremarkable except for low MCV and MCH -Sodium is 133 but glucose is 507 so corrected sodium is actually normal, potassium is 4.5 creatinine is 1.0 ALT is 55 with AST of 32 -Anion gap is 20 and bicarb is 24 -A1c is 13.9 -Beta hydroxybutyric acid is 4.2 -C -peptide pending -Patient received his first Covid vaccine awaiting the second dose  Brief Admission History:  23 y.o.malesmoker with past medical history relevant for with obesity and a family history relevant for DM in his mother presents to the ED with concerns about urinary frequency, query dysuria for the last couple of weeks.  He was admitted with DKA from newly discovered type 2 diabetes mellitus.    Assessment & Plan:   Principal Problem:   DKA (diabetic ketoacidoses) Active Problems:   Diabetes mellitus, new onset   Hypokalemia   1. DKA - Resolved now.  Pt is eating and drinking well.  He has been educated on self  administration of insulin.  He has been transitioned to basal bolus insulin this morning.  Lantus 90 units plus Novolog 30 units TIDAC plus starting metformin XR 500 mg daily with plans to titrate over next couple of weeks to goal of 2000 mg BID with meals.   TOC team arranged for PCP follow up and arranged for patient to receive all of his diabetes supplies. Referral made for outpatient diabetes and nutrition education.  TSH WNL.   2. Type 2 diabetes mellitus uncontrolled as evidenced by A1c of 13.9%.   3. Essential Hypertension - on no meds prior to admission, added losartan 50 mg daily, amlodipine 5 mg, metoprolol 25 mg BID.   4. Hypokalemia - Repleted.   5. Tobacco - counseled on cessation.  6. Vitamin D deficiency - Drisdol 50,000 IU caps prescribed.  Follow up outpatient.  7. Yeast Intertrigo - severe case, treating with topical nizoral creme BID and oral fluconazole x 4 more days.   DVT prophylaxis:  SCD/heparin  Code Status:  Full  Family Communication: father/wife at bedside   Discharge Diagnoses:  Principal Problem:   DKA (diabetic ketoacidoses) ([place]) Active Problems:   Diabetes mellitus, new onset ([place])   Hypokalemia   Severe Vitamin D deficiency   Discharge Instructions: Discharge Instructions    Referral to Nutrition and Diabetes Services   Complete by: As directed    Choose type of Diabetes Self-Management Training (DSMT) training services and number of hours requested: Initial DSMT: 10 hours   Check all special needs that apply to patient requiring 1 on 1 DSMT:  Low literacy   DSMT Content: Comprehensive self-management skills- All of the content areas   Choose the type of Medical Nutrition Therapy (MNT) and number of hours: Initial MNT: 3 hours   FOR MEDICARE PATIENTS: I hereby certify that I am managing this beneficiary's diabetes condition and that the above prescribed training is a necessary part of management.: Does not apply     Allergies as of 10/10/2019   No  Known Allergies     Medication List    STOP taking these medications   benzonatate 200 MG capsule Commonly known as: TESSALON   ibuprofen 200 MG tablet Commonly known as: ADVIL     TAKE these medications   amLODipine 5 MG tablet Commonly known as: NORVASC Take 1 tablet (5 mg total) by mouth daily. Start taking on: October 11, 2019   Basaglar KwikPen 100 UNIT/ML Inject 0.9 mLs (90 Units total) into the skin daily.   blood glucose meter kit and supplies Dispense based on patient and insurance preference. Use up to four times daily as directed. (FOR ICD-10 E10.9, E11.9).   fluconazole 200 MG tablet Commonly known as: DIFLUCAN Take 1 tablet (200 mg total) by mouth daily for 4 days. Start taking on: October 11, 2019   insulin lispro 100 UNIT/ML KwikPen Commonly known as: HUMALOG Inject 0.3 mLs (30 Units total) into the skin 3 (three) times daily.   ketoconazole 2 % cream Commonly known as: NIZORAL Apply topically 2 (two) times daily.   losartan 50 MG tablet Commonly known as: COZAAR Take 1 tablet (50 mg total) by mouth daily. Start taking on: October 11, 2019   metFORMIN 500 MG 24 hr tablet Commonly known as: GLUCOPHAGE-XR 1 po bid with meals x 1 week then 2 po BID with meals   metoprolol tartrate 25 MG tablet Commonly known as: LOPRESSOR Take 1 tablet (25 mg total) by mouth 2 (two) times daily.   Pen Needles 31G X 8 MM Misc 1 Device by Does not apply route as directed.   Vitamin D (Ergocalciferol) 1.25 MG (50000 UNIT) Caps capsule Commonly known as: DRISDOL Take 1 capsule (50,000 Units total) by mouth every 7 (seven) days. Start taking on: October 17, 2019       No Known Allergies Allergies as of 10/10/2019   No Known Allergies     Medication List    STOP taking these medications   benzonatate 200 MG capsule Commonly known as: TESSALON   ibuprofen 200 MG tablet Commonly known as: ADVIL     TAKE these medications   amLODipine 5 MG tablet Commonly known as:  NORVASC Take 1 tablet (5 mg total) by mouth daily. Start taking on: October 11, 2019   Basaglar KwikPen 100 UNIT/ML Inject 0.9 mLs (90 Units total) into the skin daily.   blood glucose meter kit and supplies Dispense based on patient and insurance preference. Use up to four times daily as directed. (FOR ICD-10 E10.9, E11.9).   fluconazole 200 MG tablet Commonly known as: DIFLUCAN Take 1 tablet (200 mg total) by mouth daily for 4 days. Start taking on: October 11, 2019   insulin lispro 100 UNIT/ML KwikPen Commonly known as: HUMALOG Inject 0.3 mLs (30 Units total) into the skin 3 (three) times daily.   ketoconazole 2 % cream Commonly known as: NIZORAL Apply topically 2 (two) times daily.   losartan 50 MG tablet Commonly known as: COZAAR Take 1 tablet (50 mg total) by mouth daily. Start taking on: October 11, 2019  metFORMIN 500 MG 24 hr tablet Commonly known as: GLUCOPHAGE-XR 1 po bid with meals x 1 week then 2 po BID with meals   metoprolol tartrate 25 MG tablet Commonly known as: LOPRESSOR Take 1 tablet (25 mg total) by mouth 2 (two) times daily.   Pen Needles 31G X 8 MM Misc 1 Device by Does not apply route as directed.   Vitamin D (Ergocalciferol) 1.25 MG (50000 UNIT) Caps capsule Commonly known as: DRISDOL Take 1 capsule (50,000 Units total) by mouth every 7 (seven) days. Start taking on: October 17, 2019      Procedures/Studies:  No results found.   Subjective: Pt reports yeast rash between legs.    Discharge Exam: Vitals:   10/10/19 0900 10/10/19 1000  BP: (!) 129/41 (!) 165/98  Pulse: 90 89  Resp: 19 20  Temp:    SpO2: 100% 99%   Vitals:   10/10/19 0720 10/10/19 0800 10/10/19 0900 10/10/19 1000  BP:  (!) 162/99 (!) 129/41 (!) 165/98  Pulse: (!) 107 91 90 89  Resp: _0 Temp: (!) 97.3 F (36.3 C)     TempSrc: Oral     SpO2: 98% 99% 100% 99%  Weight:      Height:       General: Pt is alert, awake, not in acute distress HEENT: acanthosis  nigricans Cardiovascular: RRR, S1/S2 +, no rubs, no gallops Respiratory: CTA bilaterally, no wheezing, no rhonchi Abdominal: Soft, NT, ND, bowel sounds + Extremities: no edema, no cyanosis Skin: severe intertrigo rash seen foul smell, acanthosis nigricans  The results of significant diagnostics from this hospitalization (including imaging, microbiology, ancillary and laboratory) are listed below for reference.     Microbiology: Recent Results (from the past 240 hour(s))  Urine culture     Status: Abnormal   Collection Time: 10/08/19 11:35 AM   Specimen: Urine, Random  Result Value Ref Range Status   Specimen Description   Final    URINE, RANDOM Performed at [facility], [address], [place], [place] [address]    Special Requests   Final    NONE Performed at [facility], [address] [place], [place] [address]    Culture (A)  Final    <10,000 COLONIES/mL INSIGNIFICANT GROWTH Performed at [facility] [address] [place], [place] [address]    Report Status 10/10/2019 FINAL  Final  SARS Coronavirus 2 by RT PCR (hospital order, performed in [facility] hospital lab) Nasopharyngeal Nasopharyngeal Swab     Status: None   Collection Time: 10/08/19  4:01 PM   Specimen: Nasopharyngeal Swab  Result Value Ref Range Status   SARS Coronavirus 2 NEGATIVE NEGATIVE Final    Comment: (NOTE) SARS-CoV-2 target nucleic acids are NOT DETECTED.  The SARS-CoV-2 RNA is generally detectable in upper and lower respiratory specimens during the acute phase of infection. The lowest concentration of SARS-CoV-2 viral copies this assay can detect is 250 copies / mL. A negative result does not preclude SARS-CoV-2 infection and should not be used as the sole basis for treatment or other patient management decisions.  A negative result may occur with improper specimen collection / handling, submission of specimen other than nasopharyngeal swab, presence of viral mutation(s) within  the areas targeted by this assay, and inadequate number of viral copies (<250 copies / mL). A negative result must be combined with clinical observations, patient history, and epidemiological information.  Fact Sheet for Patients:   StrictlyIdeas.no  Fact Sheet  for Healthcare Providers: BankingDealers.co.za  This test is not yet approved or  cleared by the [place] and has been authorized for detection and/or diagnosis of SARS-CoV-2 by FDA under an Emergency Use Authorization (EUA).  This EUA will remain in effect (meaning this test can be used) for the duration of the COVID-19 declaration under Section 564(b)(1) of the Act, 21 U.S.C. section 360bbb-3(b)(1), unless the authorization is terminated or revoked sooner.  Performed at [facility], [address], [place], [place] [address]   MRSA PCR Screening     Status: None   Collection Time: 10/08/19 11:36 PM   Specimen: Nasal Mucosa; Nasopharyngeal  Result Value Ref Range Status   MRSA by PCR NEGATIVE NEGATIVE Final    Comment:        The GeneXpert MRSA Assay (FDA approved for NASAL specimens only), is one component of a comprehensive MRSA colonization surveillance program. It is not intended to diagnose MRSA infection nor to guide or monitor treatment for MRSA infections. Performed at [facility], [address] [place],  [address]      Labs: BNP (last 3 results) No results for input(s): BNP in the last 8760 hours. Basic Metabolic Panel: Recent Labs  Lab 10/08/19 2010 10/09/19 0039 10/09/19 0414 10/09/19 0816 10/10/19 0415  NA 137 136 136  139 138 135  K 3.7 3.0* 2.9*  3.0* 3.4* 3.5  CL 101 103 102  104 102 103  CO2 _0 GLUCOSE 250* 188* 156*  153* 149* 259*  BUN _1 CREATININE 0.76 0.71 0.62  0.62 0.67 0.63  CALCIUM 8.4* 8.3* 8.4*  8.6* 8.5* 7.9*  MG  --  1.6*  --  1.8 1.9  PHOS  --  2.7  --   --   --     Liver Function Tests: Recent Labs  Lab 10/08/19 1156 10/09/19 0414  AST 32 60*  ALT 55* 55*  ALKPHOS 128* 88  BILITOT 1.3* 0.9  PROT 8.5* 6.6  ALBUMIN 4.4 3.3*   No results for input(s): LIPASE, AMYLASE in the last 168 hours. No results for input(s): AMMONIA in the last 168 hours. CBC: Recent Labs  Lab 10/08/19 1156 10/09/19 0414 10/10/19 0415  WBC 9.9 8.8 7.5  NEUTROABS  --   --  3.3  HGB 15.7 13.5 13.1  HCT 49.6 42.5 41.8  MCV 77.6* 78.3* 80.4  PLT 279 248 219   Cardiac Enzymes: No results for input(s): CKTOTAL, CKMB, CKMBINDEX, TROPONINI in the last 168 hours. BNP: Invalid input(s): POCBNP CBG: Recent Labs  Lab 10/09/19 1145 10/09/19 1627 10/09/19 2139 10/10/19 0318 10/10/19 0718  GLUCAP 177* 315* 218* 238* 272*   D-Dimer No results for input(s): DDIMER in the last 72 hours. Hgb A1c Recent Labs    10/08/19 1156  HGBA1C 13.9*   Lipid Profile No results for input(s): CHOL, HDL, LDLCALC, TRIG, CHOLHDL, LDLDIRECT in the last 72 hours. Thyroid function studies Recent Labs    10/10/19 0759  TSH 1.575   Anemia work up No results for input(s): VITAMINB12, FOLATE, FERRITIN, TIBC, IRON, RETICCTPCT in the last 72 hours. Urinalysis    Component Value Date/Time   COLORURINE STRAW (A) 10/08/2019 1128   APPEARANCEUR CLEAR 10/08/2019 1128   LABSPEC 1.031 (H) 10/08/2019 1128   PHURINE 5.0 10/08/2019 1128   GLUCOSEU >=500 (A) 10/08/2019 1128   HGBUR MODERATE (A) 10/08/2019 1128   BILIRUBINUR NEGATIVE 10/08/2019 1128  KETONESUR 80 (A) 10/08/2019 1128   PROTEINUR 30 (A) 10/08/2019 1128   NITRITE NEGATIVE 10/08/2019 1128   LEUKOCYTESUR SMALL (A) 10/08/2019 1128   Sepsis Labs Invalid input(s): PROCALCITONIN,  WBC,  LACTICIDVEN Microbiology Recent Results (from the past 240 hour(s))  Urine culture     Status: Abnormal   Collection Time: 10/08/19 11:35 AM   Specimen: Urine, Random  Result Value Ref Range Status   Specimen Description   Final     URINE, RANDOM Performed at [facility], [address] [place], [place] [address]    Special Requests   Final    NONE Performed at [facility], [address], [place], [place] [address]    Culture (A)  Final    <10,000 COLONIES/mL INSIGNIFICANT GROWTH Performed at [facility], [place] [address], [place], [place] [address]    Report Status 10/10/2019 FINAL  Final  SARS Coronavirus 2 by RT PCR (hospital order, performed in [facility] hospital lab) Nasopharyngeal Nasopharyngeal Swab     Status: None   Collection Time: 10/08/19  4:01 PM   Specimen: Nasopharyngeal Swab  Result Value Ref Range Status   SARS Coronavirus 2 NEGATIVE NEGATIVE Final    Comment: (NOTE) SARS-CoV-2 target nucleic acids are NOT DETECTED.  The SARS-CoV-2 RNA is generally detectable in upper and lower respiratory specimens during the acute phase of infection. The lowest concentration of SARS-CoV-2 viral copies this assay can detect is 250 copies / mL. A negative result does not preclude SARS-CoV-2 infection and should not be used as the sole basis for treatment or other patient management decisions.  A negative result may occur with improper specimen collection / handling, submission of specimen other than nasopharyngeal swab, presence of viral mutation(s) within the areas targeted by this assay, and inadequate number of viral copies (<250 copies / mL). A negative result must be combined with clinical observations, patient history, and epidemiological information.  Fact Sheet for Patients:   StrictlyIdeas.no  Fact Sheet for Healthcare Providers: BankingDealers.co.za  This test is not yet approved or  cleared by the [place] FDA and has been authorized for detection and/or diagnosis of SARS-CoV-2 by FDA under an Emergency Use Authorization (EUA).  This EUA will remain in effect (meaning this test can be used) for the duration of the COVID-19  declaration under Section 564(b)(1) of the Act, 21 U.S.C. section 360bbb-3(b)(1), unless the authorization is terminated or revoked sooner.  Performed at [facility], [address], [place], [place] [address]   MRSA PCR Screening     Status: None   Collection Time: 10/08/19 11:36 PM   Specimen: Nasal Mucosa; Nasopharyngeal  Result Value Ref Range Status   MRSA by PCR NEGATIVE NEGATIVE Final    Comment:        The GeneXpert MRSA Assay (FDA approved for NASAL specimens only), is one component of a comprehensive MRSA colonization surveillance program. It is not intended to diagnose MRSA infection nor to guide or monitor treatment for MRSA infections. Performed at [facility], [address], [place], [place] [address]    Time coordinating discharge: 45 minutes   SIGNED:  Irwin Brakeman, MD  [facility] 10/10/2019, 11:22 AM How to contact the [facility] Attending or Consulting provider [place] or covering provider during after hours [place], for this patient?  1. Check the care team in [facility] and look for a) attending/consulting [facility] provider listed and b) the [facility] team listed 2. Log into [email or web address] and use [place]'s  universal password to access. If you do not have the password, please contact the hospital operator. 3. Locate the [facility] provider you are looking for under [facility] and page to a number that you can be directly reached. 4. If you still have difficulty reaching the provider, please page the [facility] [address] (Director on Call) for the Hospitalists listed on amion for assistance.

## 2019-10-10 NOTE — Discharge Instructions (Signed)
PLEASE MONITOR BLOOD SUGAR AT LEAST 3 TIMES PER DAY.  BRING READINGS TO OFFICE VISITS WITH YOU.  PLEASE STOP ALL SMOKING AND USING ALL TOBACCO PRODUCTS.  PLEASE DON'T START VAPING.    Insulin Pen Instructions:  1. Remove Insulin pen cap and clean pen 1st with alcohol rub and then clean skin 2nd with alcohol rub 2. Twist insulin pen needle onto pen (right tighty) 3. Remove outer cap and inner cap from needle 4. Dial pen to 2 units and perform prime- press pen to zero and make sure liquid (insulin) comes out of the needle 5. Dial pen to your dose and perform injection into your abdomen 6. Hold needle in skin for 10 seconds after injection 7. Remove needle from insulin pen and discard 8. Place cap back on insulin pen and store safely (at room temperature) 9. Store unused pens in refrigerator and can keep opened insulin pen at room temperature (discard used pen after 30 days)

## 2019-10-11 LAB — C-PEPTIDE: C-Peptide: 1 ng/mL — ABNORMAL LOW (ref 1.1–4.4)

## 2019-10-11 LAB — CYCLIC CITRUL PEPTIDE ANTIBODY, IGG/IGA: CCP Antibodies IgG/IgA: 4 units (ref 0–19)

## 2019-10-17 LAB — PROINSULIN/INSULIN RATIO
Insulin: 66 u[IU]/mL — ABNORMAL HIGH
Proinsulin/Insulin Ratio: 6 %
Proinsulin: 28 pmol/L

## 2019-10-28 ENCOUNTER — Telehealth: Payer: Self-pay | Admitting: *Deleted

## 2019-10-28 NOTE — Telephone Encounter (Signed)
Ask by [facility] pharm pharmacist to see pt as new pt, spoke w/ charsetta, appt given for 7/14 at 1345 yellow. Pt will also need to speak with nenika. Attempted to call pt, goes straight to vmail, lm for rtc

## 2019-10-28 NOTE — Telephone Encounter (Signed)
Spoke w/ pt, he cannot come to clinic wed, changed appt to thurs 7/15 at 1530, gave him directions and to arrive 15 min early. He is agreeable

## 2019-10-30 ENCOUNTER — Encounter: Payer: Self-pay | Admitting: Internal Medicine

## 2019-10-31 ENCOUNTER — Ambulatory Visit (INDEPENDENT_AMBULATORY_CARE_PROVIDER_SITE_OTHER): Payer: Self-pay | Admitting: Student

## 2019-10-31 ENCOUNTER — Encounter: Payer: Self-pay | Admitting: Student

## 2019-10-31 VITALS — BP 131/70 | HR 83 | Temp 98.2°F | Ht 69.0 in | Wt 371.0 lb

## 2019-10-31 DIAGNOSIS — E559 Vitamin D deficiency, unspecified: Secondary | ICD-10-CM

## 2019-10-31 DIAGNOSIS — B372 Candidiasis of skin and nail: Secondary | ICD-10-CM | POA: Insufficient documentation

## 2019-10-31 DIAGNOSIS — Z6841 Body Mass Index (BMI) 40.0 and over, adult: Secondary | ICD-10-CM

## 2019-10-31 DIAGNOSIS — E119 Type 2 diabetes mellitus without complications: Secondary | ICD-10-CM

## 2019-10-31 DIAGNOSIS — Z794 Long term (current) use of insulin: Secondary | ICD-10-CM

## 2019-10-31 DIAGNOSIS — E1165 Type 2 diabetes mellitus with hyperglycemia: Secondary | ICD-10-CM

## 2019-10-31 DIAGNOSIS — R7401 Elevation of levels of liver transaminase levels: Secondary | ICD-10-CM

## 2019-10-31 DIAGNOSIS — I10 Essential (primary) hypertension: Secondary | ICD-10-CM

## 2019-10-31 MED ORDER — METOPROLOL TARTRATE 25 MG PO TABS: 25.0000 mg | ORAL_TABLET | Freq: Two times a day (BID) | ORAL | 3 refills | Status: DC

## 2019-10-31 MED ORDER — PEN NEEDLES 31G X 8 MM MISC: 1.0000 | 3 refills | Status: DC

## 2019-10-31 MED ORDER — BASAGLAR KWIKPEN 100 UNIT/ML ~~LOC~~ SOPN: 80.0000 [IU] | PEN_INJECTOR | Freq: Every day | SUBCUTANEOUS | 1 refills | Status: DC

## 2019-10-31 MED ORDER — LOSARTAN POTASSIUM 50 MG PO TABS: 50.0000 mg | ORAL_TABLET | Freq: Every day | ORAL | 3 refills | Status: DC

## 2019-10-31 MED ORDER — AMLODIPINE BESYLATE 5 MG PO TABS: 5.0000 mg | ORAL_TABLET | Freq: Every day | ORAL | 3 refills | Status: DC

## 2019-10-31 MED ORDER — VITAMIN D (ERGOCALCIFEROL) 1.25 MG (50000 UNIT) PO CAPS: 50000.0000 [IU] | ORAL_CAPSULE | ORAL | 0 refills | Status: DC

## 2019-10-31 MED ORDER — RELION PRIME TEST VI STRP: ORAL_STRIP | 12 refills | Status: DC

## 2019-10-31 MED ORDER — INSULIN LISPRO (1 UNIT DIAL) 100 UNIT/ML (KWIKPEN): 20.0000 [IU] | PEN_INJECTOR | Freq: Three times a day (TID) | SUBCUTANEOUS | 1 refills | Status: DC

## 2019-10-31 NOTE — Patient Instructions (Addendum)
Today, we discussed how well you have been managing your new diagnoses. Keep up the good work!  I have refilled all of your medications except for the following changes:  STOP taking Metformin START taking Lantus 80U instead of 90U, and start taking Humalog 20U rather than 30U for 3 meals.  I have sent your prescriptions to our outpatient pharmacy at discounted price: [facility], [place], [place] [address].  We will check your kidney function and cholesterol levels today.  Please continue taking your blood pressure medications and vitamin D.  Please don't hesitate to call if you need any refills or have any questions or concerns at [phone].  Go to the ED if you have hypoglycemic symptoms that do not respond to sugar (see below).   Please schedule a visit to see me in 2 weeks.  Dr. Jeralyn Bennett   Hypoglycemia Hypoglycemia is when the sugar (glucose) level in your blood is too low. Signs of low blood sugar may include:  Feeling: ? Hungry. ? Worried or nervous (anxious). ? Sweaty and clammy. ? Confused. ? Dizzy. ? Sleepy. ? Sick to your stomach (nauseous).  Having: ? A fast heartbeat. ? A headache. ? A change in your vision. ? Tingling or no feeling (numbness) around your mouth, lips, or tongue. ? Jerky movements that you cannot control (seizure).  Having trouble with: ? Moving (coordination). ? Sleeping. ? Passing out (fainting). ? Getting upset easily (irritability). Low blood sugar can happen to people who have diabetes and people who do not have diabetes. Low blood sugar can happen quickly, and it can be an emergency. Treating low blood sugar Low blood sugar is often treated by eating or drinking something sugary right away, such as:  Fruit juice, 4-6 oz (120-150 mL).  Regular soda (not diet soda), 4-6 oz (120-150 mL).  Low-fat milk, 4 oz (120 mL).  Several pieces of hard candy.  Sugar or honey, 1 Tbsp (15 mL). Treating low blood sugar if you have  diabetes If you can think clearly and swallow safely, follow the 15:15 rule:  Take 15 grams of a fast-acting carb (carbohydrate). Talk with your doctor about how much you should take.  Always keep a source of fast-acting carb with you, such as: ? Sugar tablets (glucose pills). Take 3-4 pills. ? 6-8 pieces of hard candy. ? 4-6 oz (120-150 mL) of fruit juice. ? 4-6 oz (120-150 mL) of regular (not diet) soda. ? 1 Tbsp (15 mL) honey or sugar.  Check your blood sugar 15 minutes after you take the carb.  If your blood sugar is still at or below 70 mg/dL (3.9 mmol/L), take 15 grams of a carb again.  If your blood sugar does not go above 70 mg/dL (3.9 mmol/L) after 3 tries, get help right away.  After your blood sugar goes back to normal, eat a meal or a snack within 1 hour.  Treating very low blood sugar If your blood sugar is at or below 54 mg/dL (3 mmol/L), you have very low blood sugar (severe hypoglycemia). This may also cause:  Passing out.  Jerky movements you cannot control (seizure).  Losing consciousness (coma). This is an emergency. Do not wait to see if the symptoms will go away. Get medical help right away. Call your local emergency services (911 in the [place] Do not drive yourself to the hospital. If you have very low blood sugar and you cannot eat or drink, you may need a glucagon shot (injection). A family  member or friend should learn how to check your blood sugar and how to give you a glucagon shot. Ask your doctor if you need to have a glucagon shot kit at home. Follow these instructions at home: General instructions  Take over-the-counter and prescription medicines only as told by your doctor.  Stay aware of your blood sugar as told by your doctor.  Limit alcohol intake to no more than 1 drink a day for nonpregnant women and 2 drinks a day for men. One drink equals 12 oz of beer (355 mL), 5 oz of wine (148 mL), or 1 oz of hard liquor (44 mL).  Keep all follow-up  visits as told by your doctor. This is important. If you have diabetes:   Follow your diabetes care plan as told by your doctor. Make sure you: ? Know the signs of low blood sugar. ? Take your medicines as told. ? Follow your exercise and meal plan. ? Eat on time. Do not skip meals. ? Check your blood sugar as often as told by your doctor. Always check it before and after exercise. ? Follow your sick day plan when you cannot eat or drink normally. Make this plan ahead of time with your doctor.  Share your diabetes care plan with: ? Your work or school. ? People you live with.  Check your pee (urine) for ketones: ? When you are sick. ? As told by your doctor.  Carry a card or wear jewelry that says you have diabetes. Contact a doctor if:  You have trouble keeping your blood sugar in your target range.  You have low blood sugar often. Get help right away if:  You still have symptoms after you eat or drink something sugary.  Your blood sugar is at or below 54 mg/dL (3 mmol/L).  You have jerky movements that you cannot control.  You pass out. These symptoms may be an emergency. Do not wait to see if the symptoms will go away. Get medical help right away. Call your local emergency services (911 in the [place] Do not drive yourself to the hospital. Summary  Hypoglycemia happens when the level of sugar (glucose) in your blood is too low.  Low blood sugar can happen to people who have diabetes and people who do not have diabetes. Low blood sugar can happen quickly, and it can be an emergency.  Make sure you know the signs of low blood sugar and know how to treat it.  Always keep a source of sugar (fast-acting carb) with you to treat low blood sugar. This information is not intended to replace advice given to you by your health care provider. Make sure you discuss any questions you have with your health care provider. Document Revised: 07/26/2018 Document Reviewed:  05/08/2015 Elsevier Patient Education  2020 [employer].

## 2019-10-31 NOTE — Assessment & Plan Note (Addendum)
A: Patient was admitted from [facility] ED on 10/08/19 with DKA secondary to new-onset, type II DM. Presenting symptoms included dyspepsia, dysuria, and urinary frequency. Initial HbA1c was 13.9 with blood glucose 507. Beta-hydroxybutyrate 4.2, Anion gap 20 with bicarb 24. CMP showed ALP 128, ALT 55, AST 32, TB 1.3, with glucosuria >500 and urinary ketones 80 with small leukocytes and negative nitrites. Patient was discharged on Lantus 90U in the evening and Humalog 20U TIDAC and Metformin XR 500mg  to be titrated up to 1000mg  BID. Patient ran out of hemolog 3 days ago but states his blood sugars continue to range from 100-120 in the morning and before meals. He notes occasional hypoglycemic episodes over the last few days with sugars as low as 20 on one occasion. He denies any current symptoms aside from diarrhea on Metformin. Monofilament test negative for nephropathy.  P: Given patient is having diarrhea on low dose Metformin XR with functional impairment, will discontinue Metformin. Given sugars in the low 100's without Humalog the past few days, will decrease TDI by 20%. Prescribed Lantus 80U in the evenings and Novalog 20U TIDAC. Patient is uninsured but working on obtaining Medicaid. Refilled test strips and needles to [facility] pharmacy. Will repeat CMP to assess electrolytes and r/o AG. Patient already has appointment scheduled with diabetic education and nutrition counseling.  Will defer lipid screening and urine microalbumin:Cr testing until patient receives insurance.   Addendum 11/01/19: CMP shows glucose 94 from time of visit. Will continue with plan of Glargine 80U in the evening and 20U Novalog with meals. Advised patient to call our clinic if he experiences any signs of hypoglycemia. Patient will pick up prescriptions from [facility] pharmacy and Reli on test strips from [facility].

## 2019-10-31 NOTE — Assessment & Plan Note (Addendum)
A: TSH checked 10/10/19 of 1.575. No hypothyroidism. BMI is 54.79. Patient states he is actively trying to loose weight with the help of his wife.  P: Counseled patient on portion control as well as DASH diet, stressing the importance of decreasing simple sugars and saturated/trans fats. Encouraged regular exercise.  Will consider adding GLP-1 RA at future visit to aid weight loss.

## 2019-10-31 NOTE — Progress Notes (Addendum)
   CC: Diabetic follow-up  HPI:  [name] is a 24 y.o. morbidly obese male presenting to clinic to establish care after recent admission 10/08/19 for DKA in the setting of new-onset, uncontrolled type II DM.  He states he went to the ED initially for penile Initial HbA1c on admission was 13.9 with blood glucose 507, Beta-hydroxybutyrate 4.2, Anion gap 20 with bicarb 24. CMP showed ALP 128, ALT 55, AST 32, TB 1.3, with glucosuria >500 and urinary ketones 80 with small leukocytes and negative nitrites.  Patient was discharged on Lantus 90U in the evening and Humalog 20U TIDAC and Metformin XR 500mg  to be titrated up to 1000mg  BID. Patient ran out of hemolog 3 days ago but states his blood sugars continue to range from 100-120 in the morning and before meals. He notes occasional hypoglycemic episodes over the last few days with sugars as low as 20 on one occasion. He denies any current symptoms aside from diarrhea on Metformin.   He was also diagnosed with HTN and vitamin D deficiency with hypocalcemia while admitted and was started on Losartan 50mg , amlodipine 5mg , and metoprolol tartrate 25mg  BID as well as ergocalciferol 50,000 U / week.  He denies any orthostatic hypotension, light-headedness or headaches.  He denies any current dysuria, urinary frequency, dyspepsia, nausea, vomiting, abdominal pain, numbness or tingling, blurry vision, or any symptoms other than diarrhea.  He states his penile yeast infection has resolved and denies any penile pain or itching.  Past Medical History:  Diagnosis Date  . Allergic rhinitis 07/04/2012  . Obesity, unspecified 07/04/2012   PSHx: Patient is recently married and lives with his wife.  Together, they are working to lose weight.  He is not currently working but will start a new job soon and hopes to work as a [employer] if he can be medically cleared to do so. He previously smoked 1PPD for 4 years but quit smoking 1 year ago. He uses alcohol  occasionally and denies any illicit drug use.  FHx: Patient's mother died of DM and CKD and patient's father has DM and HTN.   Allergies: Diarrhea on Metformin.   Review of Systems:  All others negative except as noted in HPI.  Vitals:   10/31/19 1457  BP: 131/70  Pulse: 83  Temp: 98.2 F (36.8 C)  TempSrc: Oral  SpO2: 97%  Weight: (!) 371 lb (168.3 kg)  Height: 5\' 9"  (1.753 m)   Physical Exam: Constitutional: Patient is morbidly obese. He appears well. No acute distress. Eyes: No conjunctival injection. Sclera non-icteric.  HENT: Moist mucus membranes. No oral lesions. Respiratory: Lungs are clear to auscultation, bilaterally. No wheezes, rales, or rhonchi. Cardiovascular: Regular rate and rhythm. No murmurs, rubs, or gallops. Distal pulses intact in all four extremities.  Neurologic: Sensation intact on monofilament testing throughout both feet. 5/5 strength in all extremities. Abdominal: Abdomen is soft and non-tender to palpation with no palpable masses. Bowel sounds intact.  GU: No CVA tenderness. Skin: No lesions notes. No jaundice.  Assessment & Plan:   See Encounters Tab for problem based charting.  Screening - patient in need of hepatitis C Ab, pneumo vaccine, TDap and 2nd COVID vaccine. Will reassess on future visit.  See addendum 11/01/19.  Patient seen with Dr. 07/06/2012.  07/06/2012, PGY1 [facility] Internal Medicine  Pager: [phone]

## 2019-10-31 NOTE — Assessment & Plan Note (Signed)
A: Patient was diagnosed with HTN on recent admission. Asymptomatic without orthostatic symptoms on Losartan 50mg , amlodipine 5mg , and metoprolol tartrate 25mg  BID. BP relatively well controlled today 131/70.   P: Continue current medication regimen and reassess.

## 2019-10-31 NOTE — Assessment & Plan Note (Signed)
A: Vitamin 25(OH)D levels 6/23/21of 10.86 with hypocalcemia of 7.9.  Patient endorses history of 5th toe fracture and family history of arthritis.  Patient was prescribed Ergocalciferol 50,000U tablets once weekly on recent admission.  P: Continue Ergocalciferol 50,000U for 6 more weeks and then begin 800mg  D3 daily thereafter. Will recheck vitamin D levels in about 3 months.

## 2019-10-31 NOTE — Assessment & Plan Note (Signed)
A: Patient originally seen in ED on 10/08/19 for yeast intertrigo of the penile foreskin. He saw that his symptoms have resolved with ketoconazole 2% cream. Likely occurred secondary to new-onset DM and glucosuria.   P: Discontinue ketoconazole and monitor for recurrence.

## 2019-11-01 ENCOUNTER — Telehealth: Payer: Self-pay | Admitting: Student

## 2019-11-01 ENCOUNTER — Other Ambulatory Visit: Payer: Self-pay | Admitting: Student

## 2019-11-01 DIAGNOSIS — R7401 Elevation of levels of liver transaminase levels: Secondary | ICD-10-CM | POA: Insufficient documentation

## 2019-11-01 LAB — CMP14 + ANION GAP
ALT: 48 IU/L — ABNORMAL HIGH (ref 0–44)
AST: 22 IU/L (ref 0–40)
Albumin/Globulin Ratio: 1.6 (ref 1.2–2.2)
Albumin: 4.2 g/dL (ref 4.1–5.2)
Alkaline Phosphatase: 103 IU/L (ref 48–121)
Anion Gap: 15 mmol/L (ref 10.0–18.0)
BUN/Creatinine Ratio: 13 (ref 9–20)
BUN: 9 mg/dL (ref 6–20)
Bilirubin Total: <0.2 mg/dL (ref 0.0–1.2)
CO2: 23 mmol/L (ref 20–29)
Calcium: 9.6 mg/dL (ref 8.7–10.2)
Chloride: 101 mmol/L (ref 96–106)
Creatinine, Ser: 0.68 mg/dL — ABNORMAL LOW (ref 0.76–1.27)
GFR calc Af Amer: 153 mL/min/{1.73_m2} (ref >59)
GFR calc non Af Amer: 133 mL/min/{1.73_m2} (ref >59)
Globulin, Total: 2.6 g/dL (ref 1.5–4.5)
Glucose: 94 mg/dL (ref 65–99)
Potassium: 4.3 mmol/L (ref 3.5–5.2)
Sodium: 139 mmol/L (ref 134–144)
Total Protein: 6.8 g/dL (ref 6.0–8.5)

## 2019-11-01 MED ORDER — RELION PRIME TEST VI STRP: ORAL_STRIP | 12 refills | Status: AC

## 2019-11-01 MED ORDER — INSULIN LISPRO (1 UNIT DIAL) 100 UNIT/ML (KWIKPEN): 20.0000 [IU] | PEN_INJECTOR | Freq: Three times a day (TID) | SUBCUTANEOUS | 1 refills | Status: DC

## 2019-11-01 MED ORDER — METOPROLOL TARTRATE 25 MG PO TABS: 25.0000 mg | ORAL_TABLET | Freq: Two times a day (BID) | ORAL | 3 refills | Status: DC

## 2019-11-01 MED ORDER — PEN NEEDLES 31G X 8 MM MISC: 1.0000 | 3 refills | Status: AC

## 2019-11-01 MED ORDER — AMLODIPINE BESYLATE 5 MG PO TABS: 5.0000 mg | ORAL_TABLET | Freq: Every day | ORAL | 3 refills | Status: DC

## 2019-11-01 MED ORDER — BASAGLAR KWIKPEN 100 UNIT/ML ~~LOC~~ SOPN: 80.0000 [IU] | PEN_INJECTOR | Freq: Every day | SUBCUTANEOUS | 1 refills | Status: DC

## 2019-11-01 MED ORDER — LOSARTAN POTASSIUM 50 MG PO TABS: 50.0000 mg | ORAL_TABLET | Freq: Every day | ORAL | 3 refills | Status: DC

## 2019-11-01 MED ORDER — VITAMIN D (ERGOCALCIFEROL) 1.25 MG (50000 UNIT) PO CAPS: 50000.0000 [IU] | ORAL_CAPSULE | ORAL | 0 refills | Status: DC

## 2019-11-01 MED FILL — HUMALOG 100 UNITS/ML KWIKPE: 100 | 25 days supply | Qty: 15 | Fill #0

## 2019-11-01 MED FILL — LANTUS SOLOSTAR 100 UNITS/M: 100 | 26 days supply | Qty: 21 | Fill #0

## 2019-11-01 MED FILL — METOPROLOL TARTRATE 25 MG T: 25 | 30 days supply | Qty: 60 | Fill #0

## 2019-11-01 MED FILL — AMLODIPINE BESYLATE 5 MG TA: 5 | 30 days supply | Qty: 30 | Fill #0

## 2019-11-01 MED FILL — UNIFINE PENTIPS 8MM 31G: 31G X 8 MM | 25 days supply | Qty: 100 | Fill #0

## 2019-11-01 MED FILL — LOSARTAN POTASSIUM 50 MG TA: 50 | 30 days supply | Qty: 30 | Fill #0

## 2019-11-01 MED FILL — VIT D2 1.25 MG (50,000 UNIT: 1.25 MG | 28 days supply | Qty: 4 | Fill #0

## 2019-11-01 NOTE — Telephone Encounter (Signed)
Spoke with [name] pharmacy regarding Stephen Silva prescriptions. All previous medication orders have now been received by the pharmacy. Given that IM program does not cover Reli on test strips, will instruct patient to pick these up from [facility].   [name], PGY1 Internal Medicine Pager: [phone]

## 2019-11-01 NOTE — Telephone Encounter (Signed)
Left a voicemail on patient's personal mobile device informing him that I reordered all of his prescriptions to [facility] outpatient pharmacy on [address] telling him prescriptions should be ready to be picked up today. Instructed patient to call clinic with any hypoglycemic symptoms once restarting his medications. Will check in with patient later today to be sure he received his prescriptions.  [name], PGY1 Internal Medicine Pager: [phone]

## 2019-11-01 NOTE — Telephone Encounter (Signed)
Spoke with patient regarding his lab results and explained that his kidney function is great although slight elevation in ALT may be secondary to his recent elevated blood sugars recently vs. Fat deposition. Patient states he tried to pick up his prescriptions yesterday but they were not available at the pharmacy. Will call [facility] and reorder prescriptions if necessary. Will inform patient when prescriptions are ready for pick-up.  [name], PGY1 Internal Medicine Pager: [phone]

## 2019-11-01 NOTE — Addendum Note (Signed)
Addended by: [name] on: 11/01/2019 10:38 AM   Modules accepted: Orders

## 2019-11-01 NOTE — Assessment & Plan Note (Signed)
A: ALT this visit is 48 with AST 22 and ALP 103. Mild transaminitis likely secondary to NAFLD given morbid obesity with infrequent alcohol use and no IV drug use. May also be secondary to recent elevations in glucose.   P: Will monitor with follow up CMP within 3 months.

## 2019-11-01 NOTE — Telephone Encounter (Signed)
Spoke with patient over the phone informing him that all of his prescriptions have been ordered through [facility] outpatient pharmacy and should be ready for pickup soon. Informed him that the pharmacy does not carry Reli on test strips through the IM program, and that these should be picked up from [facility]. Advised patient to call [facility] office if any hypoglycemic episodes occur on his insulins. Patient voices understanding and has no concerns at this time.  [name], PGY1 Internal Medicine Pager: [phone]

## 2019-11-06 NOTE — Progress Notes (Signed)
Internal Medicine Clinic Attending  I saw and evaluated the patient.  I personally confirmed the key portions of the history and exam documented by Dr. Speakman and I reviewed pertinent patient test results.  The assessment, diagnosis, and plan were formulated together and I agree with the documentation in the resident's note.  

## 2019-11-12 ENCOUNTER — Telehealth: Payer: Self-pay | Admitting: Student

## 2019-11-12 ENCOUNTER — Encounter: Payer: Self-pay | Attending: Family Medicine | Admitting: Nutrition

## 2019-11-12 ENCOUNTER — Other Ambulatory Visit: Payer: Self-pay

## 2019-11-12 ENCOUNTER — Encounter: Payer: Self-pay | Admitting: Student

## 2019-11-12 ENCOUNTER — Ambulatory Visit: Payer: Self-pay | Admitting: Nutrition

## 2019-11-12 ENCOUNTER — Ambulatory Visit (INDEPENDENT_AMBULATORY_CARE_PROVIDER_SITE_OTHER): Payer: Self-pay | Admitting: Student

## 2019-11-12 ENCOUNTER — Encounter: Payer: Self-pay | Admitting: Nutrition

## 2019-11-12 VITALS — Ht 70.0 in | Wt 366.0 lb

## 2019-11-12 DIAGNOSIS — E1165 Type 2 diabetes mellitus with hyperglycemia: Secondary | ICD-10-CM | POA: Insufficient documentation

## 2019-11-12 DIAGNOSIS — Z6841 Body Mass Index (BMI) 40.0 and over, adult: Secondary | ICD-10-CM

## 2019-11-12 DIAGNOSIS — E559 Vitamin D deficiency, unspecified: Secondary | ICD-10-CM | POA: Insufficient documentation

## 2019-11-12 DIAGNOSIS — IMO0002 Reserved for concepts with insufficient information to code with codable children: Secondary | ICD-10-CM

## 2019-11-12 DIAGNOSIS — E118 Type 2 diabetes mellitus with unspecified complications: Secondary | ICD-10-CM | POA: Insufficient documentation

## 2019-11-12 DIAGNOSIS — F4321 Adjustment disorder with depressed mood: Secondary | ICD-10-CM

## 2019-11-12 DIAGNOSIS — I10 Essential (primary) hypertension: Secondary | ICD-10-CM | POA: Insufficient documentation

## 2019-11-12 MED ORDER — INSULIN LISPRO (1 UNIT DIAL) 100 UNIT/ML (KWIKPEN): 20.0000 [IU] | PEN_INJECTOR | Freq: Three times a day (TID) | SUBCUTANEOUS | 3 refills | Status: DC

## 2019-11-12 NOTE — Telephone Encounter (Signed)
Attempted to reach patient's mobile number for telehealth appointment but no answer. Left a message to call back [phone] and I will try reaching back out to patient once more shortly.  [name], PGY1 Internal Medicine  Pager: [phone]

## 2019-11-12 NOTE — Patient Instructions (Signed)
Goals  Follow My Plate Eat 45 g CHO at meals Walk 30 minutes a day Drink a gallon of water per day Cut out processed foods Increased more fresh fruits and vegetables. Get get A1C down to 7%.

## 2019-11-12 NOTE — Assessment & Plan Note (Signed)
A: Patient has appointments scheduled with nutritionist, Pola Corn. He was advised to increase vegetable and fluid intake. He states he and his wife are working together to eat healthier and exercise together, although his depressed mood recently has made this more difficult.   P: Continue nutrition appointments.  - Today, I referred patient to Lysle Rubens for behavioral counseling.

## 2019-11-12 NOTE — Assessment & Plan Note (Addendum)
A: Patient states his blood sugars have ranged from 70-170 throughout the day on Lantus 80mg  nightly and Humalog 20mg  without any episodes of hypoglycemia. No polydipsia, polyuria, vision changes or other symptoms. Patient currently not on metformin secondary to intolerable diarrhea.  P: Continue Lantus 80U at night and Humalog 20U TID with meals until patient is able to get financial assistance. - Refilled Humalog [facility] to [facility]; patient instructed to call if he is unable to afford medications (in that case, would switch to syringe and vial) - Patient has financial advising appointment scheduled 11/20/19

## 2019-11-12 NOTE — Assessment & Plan Note (Signed)
A: Patient endorses depressed mood in the setting of recent death of his mother and grandmother.   P: Referred patient to Lysle Rubens for behavioral counseling.

## 2019-11-12 NOTE — Progress Notes (Signed)
Medical Nutrition Therapy:  Appt start time: 1400 end time:  1500.   Assessment:  Primary concerns today: Diabetes Type 2, morbid obesity. LIves with his wife. They both shop and cook together. Going back to work at [employer] soon..   Lantus 80 units at night.  20 units Humalog with meals. BS 90-140's in am. 140-170's at bedtime. BS are doing much better. Lost his grandmother and his mother 3 months apart. Has been depressed since February  2021. Has been talking to PCP about referral to counseling.   He has gained approximately 60 lbs in the last month or two since diagnosis of DM. He admits to emotional eating. Working on eating healthier now.  Diet remains high in processed foods and lower in fresh fruits and vegetables and whole grains.  He is willing to work on changing eating habits and start exercising.  At high risk for CVD and complications from DM.  Lab Results  Component Value Date   HGBA1C 13.9 (H) 10/08/2019   CMP Latest Ref Rng & Units 10/31/2019 10/10/2019 10/09/2019  Glucose 65 - 99 mg/dL 94 283(T) 517(O)  BUN 6 - 20 mg/dL 9 6 7   Creatinine 0.76 - 1.27 mg/dL ) 1.60(V 3.71  Sodium 134 - 144 mmol/L 139 135 138  Potassium 3.5 - 5.2 mmol/L 4.3 3.5 3.4(L)  Chloride 96 - 106 mmol/L 101 103 102  CO2 20 - 29 mmol/L 23 22 24   Calcium 8.7 - 10.2 mg/dL 9.6 7.9(L) 8.5(L)  Total Protein 6.0 - 8.5 g/dL 6.8 - -  Total Bilirubin 0.0 - 1.2 mg/dL 0.62 - -  Alkaline Phos 48 - 121 IU/L 103 - -  AST 0 - 40 IU/L 22 - -  ALT 0 - 44 IU/L 48(H) - -   Lipid Panel  No results found for: CHOL, TRIG, HDL, CHOLHDL, VLDL, LDLCALC, LDLDIRECT, LABVLDL   Preferred Learning Style:    No preference indicated   Learning Readiness:    Ready  Change in progress   MEDICATIONS:   DIETARY INTAKE:  24-hr recall:  B ( AM): 2 sausage patties and eggs,  Water  Snk ( AM):  L ( PM): 1 hot dog, water Snk ( PM): D ( PM): Pepper steak and rice, water Snk ( PM): Beverages: water  Usual  physical activity: ADL   Estimated energy needs: 1800  calories 200 g carbohydrates 135 g protein 50 g fat  Progress Towards Goal(s):  In progress.   Nutritional Diagnosis:  NI-1.5 Excessive energy intake As related to Morbid obesity.  As evidenced by 60 lbs weight gain in the last few months.    Intervention:  Nutrition and Diabetes education provided on My Plate, CHO counting, meal planning, portion sizes, timing of meals, avoiding snacks between meals unless having a low blood sugar, target ranges for A1C and blood sugars, signs/symptoms and treatment of hyper/hypoglycemia, monitoring blood sugars, taking medications as prescribed, benefits of exercising 30 minutes per day and prevention of complications of DM.  Goals  Follow My Plate Eat 45 g CHO at meals Walk 30 minutes a day Drink a gallon of water per day Cut out processed foods Increased more fresh fruits and vegetables. Get get A1C down to 7%..  Teaching Method Utilized:  Visual Auditory Hands on  Handouts given during visit include:  The plate method    Meal plan card  Diabetes instructions.    Barriers to learning/adherence to lifestyle change: none  Demonstrated degree of understanding via:  Teach Back  Monitoring/Evaluation:  Dietary intake, exercise, , and body weight in 1 month(s).  Refer to counseling.

## 2019-11-12 NOTE — Progress Notes (Signed)
°  [facility] Internal Medicine Residency Telephone Encounter Continuity Care Appointment  HPI:   This telephone encounter was created for [name] on 11/12/2019 for the following purpose/cc: diabetic follow-up.  Today, I spoke with Stephen Silva about his new-onset diabetes diagnosed 10/08/19. Given low blood sugars last visit, his Lantus was decreased from 90 to 80U nightly, and his Humalog was decreased from 30U TID to 20U TID with meals. He continues to use his injectables regularly and has been routinely monitoring blood glucoses, which have ranged from 70-170 throughout the day. He denies any thirst, increased urination, nausea, vomiting, abdominal pain, or vision changes. He denies any hypoglycemic symptoms including shakiness, sweating, or confusion. He had an appointment earlier this afternoon with Pola Corn for nutrition consult and says he plans to increase vegetable and water intake. He and his wife intend to start eating healthy and exercising together. He does note depressed mood since his mother died recently, although he finds keeping his mind busy helps. He is interested in referral for counseling today.   Past Medical History:  Past Medical History:  Diagnosis Date   Allergic rhinitis 07/04/2012   Diabetes mellitus type II, uncontrolled (HCC) 10/08/2019   Hypertension 10/08/2019   Hypocalcemia    Obesity, unspecified 07/04/2012   Vitamin D deficiency       PSHx: Patient lives at home with his wife. He states he is trying to get a job at [employer].   ROS:   All others negative except as noted above in HPI.   Assessment / Plan / Recommendations:   Please see A&P under problem oriented charting for assessment of the patient's acute and chronic medical conditions.   As always, pt is advised that if symptoms worsen or new symptoms arise, they should go to an urgent care facility or to to ER for further evaluation.   Consent and Medical Decision Making:     Patient discussed with Dr. Oswaldo Done  This is a telephone encounter between Stephen Silva and Stephen Silva on 11/12/2019 for diabetes follow-up. The visit was conducted with the patient located at home and Stephen Silva at [facility]. The patient's identity was confirmed using their DOB and current address. The patient has consented to being evaluated through a telephone encounter and understands the associated risks (an examination cannot be done and the patient may need to come in for an appointment) / benefits (allows the patient to remain at home, decreasing exposure to coronavirus). I personally spent 15 minutes on medical discussion.

## 2019-11-13 NOTE — Progress Notes (Signed)
Internal Medicine Clinic Attending  I was present for the telephone visit with this patient. I personally confirmed the key portions of the history and exam documented by Dr. Laddie Aquas and I reviewed pertinent patient test results.  The assessment, diagnosis, and plan were formulated together and I agree with the documentation in the residents note.

## 2019-11-14 ENCOUNTER — Encounter: Payer: Self-pay | Admitting: Nutrition

## 2019-11-20 ENCOUNTER — Ambulatory Visit: Payer: Self-pay

## 2019-11-25 MED FILL — LANTUS SOLOSTAR 100 UNITS/M: 100 | 26 days supply | Qty: 21 | Fill #1

## 2019-11-25 MED FILL — LOSARTAN POTASSIUM 50 MG TA: 50 | 30 days supply | Qty: 30 | Fill #1

## 2019-11-25 MED FILL — METOPROLOL TARTRATE 25 MG T: 25 | 30 days supply | Qty: 60 | Fill #1

## 2019-11-25 MED FILL — AMLODIPINE BESYLATE 5 MG TA: 5 | 30 days supply | Qty: 30 | Fill #1

## 2019-11-25 MED FILL — HUMALOG 100 UNITS/ML KWIKPE: 100 | 25 days supply | Qty: 15 | Fill #1

## 2019-12-10 ENCOUNTER — Encounter: Payer: Self-pay | Admitting: Licensed Clinical Social Worker

## 2019-12-17 ENCOUNTER — Encounter: Payer: Self-pay | Attending: Family Medicine | Admitting: Nutrition

## 2019-12-17 ENCOUNTER — Encounter: Payer: Self-pay | Admitting: Nutrition

## 2019-12-17 ENCOUNTER — Other Ambulatory Visit: Payer: Self-pay

## 2019-12-17 VITALS — Ht 70.0 in | Wt 370.0 lb

## 2019-12-17 DIAGNOSIS — E118 Type 2 diabetes mellitus with unspecified complications: Secondary | ICD-10-CM | POA: Insufficient documentation

## 2019-12-17 DIAGNOSIS — I10 Essential (primary) hypertension: Secondary | ICD-10-CM | POA: Insufficient documentation

## 2019-12-17 DIAGNOSIS — E1165 Type 2 diabetes mellitus with hyperglycemia: Secondary | ICD-10-CM | POA: Insufficient documentation

## 2019-12-17 DIAGNOSIS — IMO0002 Reserved for concepts with insufficient information to code with codable children: Secondary | ICD-10-CM

## 2019-12-17 NOTE — Progress Notes (Signed)
  Medical Nutrition Therapy:  Appt start time: 1100  end time:  1130   Assessment:  Primary concerns today: Diabetes Type 2, morbid obesity. LIves with his wife.    He has been drinking a lot more water. Eating more vegetables.  [place] [place]. 14 day avg 133 30 day avg 147 mg/dl.  Taking 20 units of Novolog before meals and 80 units of Lantus  at  Night.  He is willing to work on changing eating habits and start exercising.  At high risk for CVD and complications from DM.  Lab Results  Component Value Date   HGBA1C 13.9 (H) 10/08/2019   CMP Latest Ref Rng & Units 10/31/2019 10/10/2019 10/09/2019  Glucose 65 - 99 mg/dL 94 096(G) 836(O)  BUN 6 - 20 mg/dL 9 6 7   Creatinine 0.76 - 1.27 mg/dL ) 2.94(T 6.54  Sodium 134 - 144 mmol/L 139 135 138  Potassium 3.5 - 5.2 mmol/L 4.3 3.5 3.4(L)  Chloride 96 - 106 mmol/L 101 103 102  CO2 20 - 29 mmol/L 23 22 24   Calcium 8.7 - 10.2 mg/dL 9.6 7.9(L) 8.5(L)  Total Protein 6.0 - 8.5 g/dL 6.8 - -  Total Bilirubin 0.0 - 1.2 mg/dL 6.50 - -  Alkaline Phos 48 - 121 IU/L 103 - -  AST 0 - 40 IU/L 22 - -  ALT 0 - 44 IU/L 48(H) - -     Preferred Learning Style:    No preference indicated   Learning Readiness:    Ready  Change in progress   MEDICATIONS:   DIETARY INTAKE:  24-hr recall:  B ( AM): Oatmeal,  Water Snk ( AM):  L ( PM): tacos, 2, water and salad Snk ( PM): D ( PM): Salad with chicken, water  Snk ( PM): Beverages: water  Usual physical activity: ADL   Estimated energy needs: 1800  calories 200 g carbohydrates 135 g protein 50 g fat  Progress Towards Goal(s):  In progress.   Nutritional Diagnosis:  NI-1.5 Excessive energy intake As related to Morbid obesity.  As evidenced by 60 lbs weight gain in the last few months.    Intervention:  Nutrition and Diabetes education provided on My Plate, CHO counting, meal planning, portion sizes, timing of meals, avoiding snacks between meals unless  having a low blood sugar, target ranges for A1C and blood sugars, signs/symptoms and treatment of hyper/hypoglycemia, monitoring blood sugars, taking medications as prescribed, benefits of exercising 30 minutes per day and prevention of complications of DM.  Goals  Check blood sugars 4 times per day and record Increase walking 30 minutes 4 times per week Keep drinking water Add fruit and nuts to oatmeal in meal in the am. Keep up the great job!! .  Teaching Method Utilized:  Visual Auditory Hands on  Handouts given during visit include:  The plate method    Meal plan card  Diabetes instructions.    Barriers to learning/adherence to lifestyle change: none  Demonstrated degree of understanding via:  Teach Back   Monitoring/Evaluation:  Dietary intake, exercise, , and body weight in 3 month(s).  Refer to counseling. He would benefit from a GLP 1 for needed weight lost and improve postprandial blood sugar readings.

## 2019-12-17 NOTE — Patient Instructions (Signed)
Goals  Check blood sugars 4 times per day and record Increase walking 30 minutes 4 times per week Keep drinking water Add fruit and nuts to oatmeal in meal in the am. Keep up the great job!!

## 2019-12-19 ENCOUNTER — Ambulatory Visit
Admission: EM | Admit: 2019-12-19 | Discharge: 2019-12-19 | Disposition: A | Discharge status: Home / Self Care | Payer: Self-pay | Attending: Emergency Medicine | Admitting: Emergency Medicine

## 2019-12-19 ENCOUNTER — Encounter: Payer: Self-pay | Admitting: Emergency Medicine

## 2019-12-19 DIAGNOSIS — G8929 Other chronic pain: Secondary | ICD-10-CM

## 2019-12-19 DIAGNOSIS — M5442 Lumbago with sciatica, left side: Secondary | ICD-10-CM

## 2019-12-19 MED ORDER — IBUPROFEN 800 MG PO TABS
800.0000 mg | ORAL_TABLET | Freq: Three times a day (TID) | ORAL | 0 refills | Status: DC
Start: 2019-12-19 — End: 2020-05-25

## 2019-12-19 MED ORDER — IBUPROFEN 800 MG PO TABS
800.0000 mg | ORAL_TABLET | Freq: Three times a day (TID) | ORAL | 0 refills | Status: DC
Start: 2019-12-19 — End: 2019-12-19

## 2019-12-19 MED ORDER — CYCLOBENZAPRINE HCL 5 MG PO TABS: 5.0000 mg | ORAL_TABLET | Freq: Three times a day (TID) | ORAL | 0 refills | Status: DC | PRN

## 2019-12-19 NOTE — Discharge Instructions (Addendum)
Rest, ice and heat as needed Ensure adequate ROM as tolerated. Prescribed ibuprofen as needed for inflammation and pain relief Prescribed flexeril  for muscle spasm.  Do not drive or operate heavy machinery while taking this medication Return here or go to ER if you have any new or worsening symptoms such as numbness/tingling of the inner thighs, loss of bladder or bowel control, headache/blurry vision, nausea/vomiting, confusion/altered mental status, dizziness, weakness, passing out, imbalance, etc...   

## 2019-12-19 NOTE — ED Triage Notes (Signed)
Lower mid lime pain since last night, denies any injury. Hx of chronic back pain.

## 2019-12-19 NOTE — ED Provider Notes (Signed)
.  [facility]   [id number] 12/19/19 Arrival Time: 1000   Chief Complaint  Patient presents with  . Back Pain     SUBJECTIVE: History from: patient.  Stephen Silva is a 24 y.o. male with history of chronic back pain and diabetes presents to the urgent care for complaint of lower mid back pain that started last night.  Denies any precipitating event, trauma or injury.  He localizes the pain to the lower mid back.  He describes the pain as constant and achy.  He has tried OTC medications without relief.  His symptoms are made worse with ROM.  He denies similar symptoms in the past.  Denies chills, fever, nausea, vomiting, diarrhea, LOC, paresthesia, loss of sensation, facial droop.   ROS: As per HPI.  All other pertinent ROS negative.     Past Medical History:  Diagnosis Date  . Allergic rhinitis 07/04/2012  . Diabetes mellitus type II, uncontrolled ([place]) 10/08/2019  . Hypertension 10/08/2019  . Hypocalcemia   . Obesity, unspecified 07/04/2012  . Vitamin D deficiency    Past Surgical History:  Procedure Laterality Date  . ADENOIDECTOMY     No Known Allergies No current facility-administered medications on file prior to encounter.   Current Outpatient Medications on File Prior to Encounter  Medication Sig Dispense Refill  . amLODipine (NORVASC) 5 MG tablet Take 1 tablet (5 mg total) by mouth daily. 30 tablet 3  . blood glucose meter kit and supplies Dispense based on patient and insurance preference. Use up to four times daily as directed. (FOR ICD-10 E10.9, E11.9). 1 each 5  . glucose blood (RELION PRIME TEST) test strip Use as instructed 100 each 12  . Insulin Glargine (BASAGLAR KWIKPEN) 100 UNIT/ML Inject 0.8 mLs (80 Units total) into the skin daily. 27 mL 1  . insulin lispro (HUMALOG) 100 UNIT/ML KwikPen Inject 0.2 mLs (20 Units total) into the skin 3 (three) times daily. 15 mL 3  . Insulin Pen Needle (PEN NEEDLES) 31G X 8 MM MISC 1 Device by Does not apply  route as directed. 90 each 3  . losartan (COZAAR) 50 MG tablet Take 1 tablet (50 mg total) by mouth daily. 30 tablet 3  . metoprolol tartrate (LOPRESSOR) 25 MG tablet Take 1 tablet (25 mg total) by mouth 2 (two) times daily. 60 tablet 3  . Vitamin D, Ergocalciferol, (DRISDOL) 1.25 MG (50000 UNIT) CAPS capsule Take 1 capsule (50,000 Units total) by mouth every 7 (seven) days. 6 capsule 0   Social History   Socioeconomic History  . Marital status: Married    Spouse name: Not on file  . Number of children: Not on file  . Years of education: Not on file  . Highest education level: Not on file  Occupational History  . Occupation: unemployed  Tobacco Use  . Smoking status: Former Smoker    Packs/day: 1.00    Years: 4.00    Pack years: 4.00    Types: Cigarettes    Quit date: 10/31/2018    Years since quitting: 1.1  . Smokeless tobacco: Never Used  Vaping Use  . Vaping Use: Never used  Substance and Sexual Activity  . Alcohol use: Yes    Comment: occasional  . Drug use: No  . Sexual activity: Not on file  Other Topics Concern  . Not on file  Social History Narrative   Patient is recently married living with his wife. He is currently unemployed but will begin a new  job soon and hopes to one day work with [employer].    Social Determinants of Health   Financial Resource Strain:   . Difficulty of Paying Living Expenses: Not on file  Food Insecurity:   . Worried About Charity fundraiser in the Last Year: Not on file  . Ran Out of Food in the Last Year: Not on file  Transportation Needs:   . Lack of Transportation (Medical): Not on file  . Lack of Transportation (Non-Medical): Not on file  Physical Activity:   . Days of Exercise per Week: Not on file  . Minutes of Exercise per Session: Not on file  Stress:   . Feeling of Stress : Not on file  Social Connections:   . Frequency of Communication with Friends and Family: Not on file  . Frequency of Social Gatherings with  Friends and Family: Not on file  . Attends Religious Services: Not on file  . Active Member of Clubs or Organizations: Not on file  . Attends Archivist Meetings: Not on file  . Marital Status: Not on file  Intimate Partner Violence:   . Fear of Current or Ex-Partner: Not on file  . Emotionally Abused: Not on file  . Physically Abused: Not on file  . Sexually Abused: Not on file   Family History  Problem Relation Age of Onset  . Diabetes Mother   . Heart disease Mother   . Kidney disease Mother   . Diabetes Father   . Hypertension Father     OBJECTIVE:  Vitals:   12/19/19 1021  BP: 129/85  Pulse: 85  Resp: 17  Temp: 98.2 F (36.8 C)  TempSrc: Oral  SpO2: 98%     Physical Exam Vitals and nursing note reviewed.  Constitutional:      General: He is not in acute distress.    Appearance: Normal appearance. He is normal weight. He is not ill-appearing, toxic-appearing or diaphoretic.  Cardiovascular:     Rate and Rhythm: Normal rate and regular rhythm.     Pulses: Normal pulses.     Heart sounds: Normal heart sounds. No murmur heard.  No friction rub. No gallop.   Pulmonary:     Effort: Pulmonary effort is normal. No respiratory distress.     Breath sounds: Normal breath sounds. No stridor. No wheezing, rhonchi or rales.  Chest:     Chest wall: No tenderness.  Musculoskeletal:        General: Tenderness present.     Lumbar back: Spasms and tenderness present.     Comments: Back:  Patient ambulates from chair to exam table without difficulty.  Inspection: Skin clear and intact without obvious swelling, erythema, or ecchymosis. Warm to the touch  Palpation: Vertebral processes nontender. Tenderness about the lower paravertebral muscles  ROM: FROM Strength: 5/5 hip flexion, 5/5 knee extension, 5/5 knee flexion, 5/5 plantar flexion, 5/5 dorsiflexion  Special Tests: Negative Straight leg raise  Neurological:     Mental Status: He is alert and oriented to  person, place, and time.      LABS:  No results found for this or any previous visit (from the past 24 hour(s)).   ASSESSMENT & PLAN:  1. Chronic midline low back pain with left-sided sciatica     Meds ordered this encounter  Medications  . cyclobenzaprine (FLEXERIL) 5 MG tablet    Sig: Take 1 tablet (5 mg total) by mouth 3 (three) times daily as needed.    Dispense:  30 tablet    Refill:  0  . ibuprofen (ADVIL) 800 MG tablet    Sig: Take 1 tablet (800 mg total) by mouth 3 (three) times daily. Take with food    Dispense:  30 tablet    Refill:  0   Discharge instructions Rest, ice and heat as needed Ensure adequate ROM as tolerated. Prescribed ibuprofen as needed for inflammation and pain relief Prescribed flexeril  for muscle spasm.  Do not drive or operate heavy machinery while taking this medication Return here or go to ER if you have any new or worsening symptoms such as numbness/tingling of the inner thighs, loss of bladder or bowel control, headache/blurry vision, nausea/vomiting, confusion/altered mental status, dizziness, weakness, passing out, imbalance, etc...   Reviewed expectations re: course of current medical issues. Questions answered. Outlined signs and symptoms indicating need for more acute intervention. Patient verbalized understanding. After Visit Summary given.      Note: This document was prepared using Dragon voice recognition software and may include unintentional dictation errors.    Emerson Monte, FNP 12/19/19 1104

## 2019-12-20 ENCOUNTER — Other Ambulatory Visit: Payer: Self-pay | Admitting: Internal Medicine

## 2019-12-20 ENCOUNTER — Other Ambulatory Visit: Payer: Self-pay | Admitting: Student

## 2019-12-20 DIAGNOSIS — E1165 Type 2 diabetes mellitus with hyperglycemia: Secondary | ICD-10-CM

## 2019-12-20 MED ORDER — INSULIN LISPRO (1 UNIT DIAL) 100 UNIT/ML (KWIKPEN): 20.0000 [IU] | PEN_INJECTOR | Freq: Three times a day (TID) | SUBCUTANEOUS | 0 refills | Status: DC

## 2019-12-20 MED ORDER — LANTUS SOLOSTAR 100 UNIT/ML ~~LOC~~ SOPN: 80.0000 [IU] | PEN_INJECTOR | Freq: Every day | SUBCUTANEOUS | 0 refills | Status: DC

## 2019-12-20 MED FILL — METOPROLOL TARTRATE 25 MG T: 25 | 30 days supply | Qty: 60 | Fill #2

## 2019-12-20 MED FILL — VIT D2 1.25 MG (50,000 UNIT: 1.25 MG | 13 days supply | Qty: 2 | Fill #1

## 2019-12-20 MED FILL — LANTUS SOLOSTAR 100 UNITS/M: 100 | 14 days supply | Qty: 12 | Fill #2

## 2019-12-20 MED FILL — LOSARTAN POTASSIUM 50 MG TA: 50 | 30 days supply | Qty: 30 | Fill #2

## 2019-12-20 MED FILL — AMLODIPINE BESYLATE 5 MG TA: 5 | 30 days supply | Qty: 30 | Fill #2

## 2019-12-20 MED FILL — HUMALOG 100 UNITS/ML KWIKPE: 100 | 30 days supply | Qty: 18 | Fill #0

## 2020-01-21 ENCOUNTER — Other Ambulatory Visit: Payer: Self-pay | Admitting: Student

## 2020-01-21 ENCOUNTER — Encounter: Payer: Self-pay | Admitting: Student

## 2020-01-21 DIAGNOSIS — E559 Vitamin D deficiency, unspecified: Secondary | ICD-10-CM

## 2020-01-21 MED FILL — LOSARTAN POTASSIUM 50 MG TA: 50 | 30 days supply | Qty: 30 | Fill #3

## 2020-01-21 MED FILL — METOPROLOL TARTRATE 25 MG T: 25 | 30 days supply | Qty: 60 | Fill #3

## 2020-01-21 MED FILL — HUMALOG 100 UNITS/ML KWIKPE: 100 | 30 days supply | Qty: 18 | Fill #1

## 2020-01-21 MED FILL — AMLODIPINE BESYLATE 5 MG TA: 5 | 30 days supply | Qty: 30 | Fill #3

## 2020-01-21 MED FILL — LANTUS SOLOSTAR 100 UNITS/M: 100 | 30 days supply | Qty: 24 | Fill #0

## 2020-01-22 ENCOUNTER — Telehealth: Payer: Self-pay | Admitting: Student

## 2020-01-22 NOTE — Telephone Encounter (Signed)
Left a voicemail on mobile device stating that we would hold off on prescribing any more vitamin D to his pharmacy until Stephen Silva is able to be seen in person for an [facility] visit for repeat lab work. Informed patient to call back clinic number to reschedule visit as soon as possible.  [name], PGY1 Internal Medicine [phone]

## 2020-01-23 ENCOUNTER — Encounter: Payer: Self-pay | Admitting: Student

## 2020-01-27 ENCOUNTER — Encounter: Payer: Self-pay | Admitting: Nutrition

## 2020-01-30 ENCOUNTER — Ambulatory Visit: Payer: Self-pay | Admitting: Nutrition

## 2020-02-20 ENCOUNTER — Other Ambulatory Visit: Payer: Self-pay

## 2020-02-20 ENCOUNTER — Ambulatory Visit: Payer: Self-pay | Admitting: Nutrition

## 2020-02-20 ENCOUNTER — Encounter: Payer: Self-pay | Admitting: Nutrition

## 2020-02-20 NOTE — Patient Instructions (Addendum)
Goals  Keep up the great job Eat protein with all meals Eat 2-3 carb choices Exercise: keep it up !!

## 2020-02-20 NOTE — Progress Notes (Signed)
Medical Nutrition Therapy:  Appt start time: 1445   end time: 1500  Assessment:  Primary concerns today: Diabetes Type 2, morbid obesity. LIves with his wife.     Has been drinking a lot more water now. Feels a lot better.  FBS: 111-140's  BS before lunch: 109-129 mg/dl  Bedtime: 008-676'P.  Changed: cut out processed foods. Not as stressed as much, praying more and talking to family to deal with stress. He has been talking to his Dad a lot more. 80 units of Lantus and 20 units of novolog with meals plus sliding scale.     He has been drinking a lot more water. Eating more vegetables.  [place]  [employer] [place] in [place]. 14 day avg 133 30 day avg 147 mg/dl.  Taking 20 units of Novolog before meals and 80 units of Lantus  at  Night.  He is willing to work on changing eating habits and start exercising.  At high risk for CVD and complications from DM.  Lab Results  Component Value Date   HGBA1C 13.9 (H) 10/08/2019   CMP Latest Ref Rng & Units 10/31/2019 10/10/2019 10/09/2019  Glucose 65 - 99 mg/dL 94 950(D) 326(Z)  BUN 6 - 20 mg/dL 9 6 7   Creatinine 0.76 - 1.27 mg/dL ) 1.24(P 8.09  Sodium 134 - 144 mmol/L 139 135 138  Potassium 3.5 - 5.2 mmol/L 4.3 3.5 3.4(L)  Chloride 96 - 106 mmol/L 101 103 102  CO2 20 - 29 mmol/L 23 22 24   Calcium 8.7 - 10.2 mg/dL 9.6 7.9(L) 8.5(L)  Total Protein 6.0 - 8.5 g/dL 6.8 - -  Total Bilirubin 0.0 - 1.2 mg/dL 9.83 - -  Alkaline Phos 48 - 121 IU/L 103 - -  AST 0 - 40 IU/L 22 - -  ALT 0 - 44 IU/L 48(H) - -     Preferred Learning Style:    No preference indicated   Learning Readiness:    Ready  Change in progress   MEDICATIONS:   DIETARY INTAKE:  24-hr recall:  B ( AM):  Oatmeal and water L ( PM): Salad, cesear, water Snk ( PM): D ( PM): Leftover salad, 2 chicken wings grilled, water Snk ( PM): Beverages: water  Usual physical activity: ADL   Estimated energy needs: 1800  calories 200 g carbohydrates 135 g protein 50  g fat  Progress Towards Goal(s):  In progress.   Nutritional Diagnosis:  NI-1.5 Excessive energy intake As related to Morbid obesity.  As evidenced by 60 lbs weight gain in the last few months.    Intervention:  Nutrition and Diabetes education provided on My Plate, CHO counting, meal planning, portion sizes, timing of meals, avoiding snacks between meals unless having a low blood sugar, target ranges for A1C and blood sugars, signs/symptoms and treatment of hyper/hypoglycemia, monitoring blood sugars, taking medications as prescribed, benefits of exercising 30 minutes per day and prevention of complications of DM.  Goals  Check blood sugars 4 times per day and record Increase walking 30 minutes 4 times per week Keep drinking water Add fruit and nuts to oatmeal in meal in the am. Keep up the great job!! .  Teaching Method Utilized:  Visual Auditory Hands on  Handouts given during visit include:  The plate method    Meal plan card  Diabetes instructions.    Barriers to learning/adherence to lifestyle change: none  Demonstrated degree of understanding via:  Teach Back   Monitoring/Evaluation:  Dietary intake, exercise, ,  and body weight in 3 month(s).  Refer to counseling. He would benefit from a GLP 1 for needed weight lost and improve postprandial blood sugar readings.

## 2020-02-28 ENCOUNTER — Other Ambulatory Visit: Payer: Self-pay | Admitting: Student

## 2020-02-28 ENCOUNTER — Other Ambulatory Visit: Payer: Self-pay | Admitting: Internal Medicine

## 2020-02-28 DIAGNOSIS — I10 Essential (primary) hypertension: Secondary | ICD-10-CM

## 2020-02-28 MED ORDER — AMLODIPINE BESYLATE 5 MG PO TABS: 5.0000 mg | ORAL_TABLET | Freq: Every day | ORAL | 0 refills | Status: DC

## 2020-02-28 MED ORDER — LOSARTAN POTASSIUM 50 MG PO TABS: 50.0000 mg | ORAL_TABLET | Freq: Every day | ORAL | 0 refills | Status: DC

## 2020-02-28 MED ORDER — METOPROLOL TARTRATE 25 MG PO TABS: 25.0000 mg | ORAL_TABLET | Freq: Two times a day (BID) | ORAL | 0 refills | Status: DC

## 2020-02-28 MED FILL — LANTUS SOLOSTAR 100 UNITS/M: 100 | 30 days supply | Qty: 24 | Fill #1

## 2020-02-28 MED FILL — AMLODIPINE BESYLATE 5 MG TA: 5 | 30 days supply | Qty: 30 | Fill #0

## 2020-02-28 MED FILL — HUMALOG 100 UNITS/ML KWIKPE: 100 | 30 days supply | Qty: 18 | Fill #2

## 2020-02-28 MED FILL — LOSARTAN POTASSIUM 50 MG TA: 50 | 30 days supply | Qty: 30 | Fill #0

## 2020-02-28 MED FILL — METOPROLOL TARTRATE 25 MG T: 25 | 30 days supply | Qty: 60 | Fill #0

## 2020-02-28 NOTE — Telephone Encounter (Signed)
Please contact patient and let him know he will need an office visit before additional refills are sent.   Thank you! Dr. Huel Cote

## 2020-04-08 ENCOUNTER — Other Ambulatory Visit: Payer: Self-pay | Admitting: Internal Medicine

## 2020-04-08 DIAGNOSIS — E1165 Type 2 diabetes mellitus with hyperglycemia: Secondary | ICD-10-CM

## 2020-04-08 MED FILL — AMLODIPINE BESYLATE 5 MG TA: 5 | 30 days supply | Qty: 30 | Fill #0

## 2020-04-08 MED FILL — LANTUS SOLOSTAR 100 UNITS/M: 100 | 30 days supply | Qty: 24 | Fill #2

## 2020-04-08 MED FILL — LOSARTAN POTASSIUM 50 MG TA: 50 | 30 days supply | Qty: 30 | Fill #0

## 2020-04-08 MED FILL — METOPROLOL TARTRATE 25 MG T: 25 | 30 days supply | Qty: 60 | Fill #0

## 2020-04-09 ENCOUNTER — Other Ambulatory Visit: Payer: Self-pay | Admitting: Internal Medicine

## 2020-04-09 DIAGNOSIS — E1165 Type 2 diabetes mellitus with hyperglycemia: Secondary | ICD-10-CM

## 2020-04-09 MED ORDER — INSULIN LISPRO (1 UNIT DIAL) 100 UNIT/ML (KWIKPEN): PEN_INJECTOR | SUBCUTANEOUS | 0 refills | Status: DC

## 2020-04-09 MED FILL — HUMALOG 100 UNITS/ML KWIKPE: 100 | 30 days supply | Qty: 18 | Fill #0

## 2020-04-20 ENCOUNTER — Other Ambulatory Visit: Payer: Self-pay

## 2020-04-20 ENCOUNTER — Other Ambulatory Visit: Payer: Self-pay | Admitting: Internal Medicine

## 2020-04-20 ENCOUNTER — Encounter: Payer: Self-pay | Admitting: Student

## 2020-04-20 ENCOUNTER — Ambulatory Visit (INDEPENDENT_AMBULATORY_CARE_PROVIDER_SITE_OTHER): Payer: Self-pay | Admitting: Internal Medicine

## 2020-04-20 VITALS — BP 125/71 | HR 73 | Wt 374.4 lb

## 2020-04-20 DIAGNOSIS — Z23 Encounter for immunization: Secondary | ICD-10-CM

## 2020-04-20 DIAGNOSIS — I10 Essential (primary) hypertension: Secondary | ICD-10-CM

## 2020-04-20 DIAGNOSIS — E559 Vitamin D deficiency, unspecified: Secondary | ICD-10-CM

## 2020-04-20 DIAGNOSIS — E1110 Type 2 diabetes mellitus with ketoacidosis without coma: Secondary | ICD-10-CM

## 2020-04-20 DIAGNOSIS — Z0000 Encounter for general adult medical examination without abnormal findings: Secondary | ICD-10-CM | POA: Insufficient documentation

## 2020-04-20 DIAGNOSIS — Z794 Long term (current) use of insulin: Secondary | ICD-10-CM

## 2020-04-20 DIAGNOSIS — E1165 Type 2 diabetes mellitus with hyperglycemia: Secondary | ICD-10-CM

## 2020-04-20 LAB — POCT GLYCOSYLATED HEMOGLOBIN (HGB A1C): Hemoglobin A1C: 6.2 % — AB (ref 4.0–5.6)

## 2020-04-20 LAB — GLUCOSE, CAPILLARY: Glucose-Capillary: 97 mg/dL (ref 70–99)

## 2020-04-20 MED ORDER — LANTUS SOLOSTAR 100 UNIT/ML ~~LOC~~ SOPN: 80.0000 [IU] | PEN_INJECTOR | Freq: Every day | SUBCUTANEOUS | 0 refills | Status: DC

## 2020-04-20 MED ORDER — INSULIN LISPRO (1 UNIT DIAL) 100 UNIT/ML (KWIKPEN): PEN_INJECTOR | SUBCUTANEOUS | 0 refills | Status: DC

## 2020-04-20 MED ORDER — METFORMIN HCL ER 500 MG PO TB24: 500.0000 mg | ORAL_TABLET | Freq: Every day | ORAL | 1 refills | Status: DC

## 2020-04-20 MED FILL — METFORMIN HCL ER 500 MG TB2: 500 | 30 days supply | Qty: 30 | Fill #0

## 2020-04-20 MED FILL — HUMALOG 100 UNITS/ML KWIKPE: 100 | 30 days supply | Qty: 18 | Fill #0

## 2020-04-20 NOTE — Assessment & Plan Note (Signed)
Hgb A1c 13.9 % at the last visit. Current A1c 6.2%. Average glucometer reading is estimated by patient to be  120 over the past 30 days with a high of 140 and low of 89 which was/was not symptomatic.  He has continued to gain some weight , although he has been physically active at work and also eating healthier. Encouraged patient our goal is to help with weight loss and we can try to start him on oral medications given his recent improvement in Hgb A1C. He has had a intolerance to metformin in the past. He willing to start back on low dose Metformin. He would like to come off of Insulin and not be a diabetic in the future. I encouraged increasing his aerobic activity aimed at weight loss and to continue working on eating a healthy diet.   Assessment: Controlled T2DM. On Lantus 80 qhs, and Humalog 20U TID Plan: Continue Lantus 80U at night Continue Humalog 20U TID w/meals Start Metformin XR 500 mg , can titrate up but patient had intolerance when he took 1000 mg in the past. - Consider additional oral medications at next visit and work on titrating down Insulin as able.  - Repeat A1c at next visit if after 3 months from this date

## 2020-04-20 NOTE — Progress Notes (Signed)
   CC: Vitamin D deficiency, Type 2 diabetes mellitus, and hypertension  HPI:[name] is a 25 y.o. male who presents for evaluation of hypertension, type 2 diabetes mellitus, and Vitamin D Deficiency. Please see individual problem based A/P for details.   Past Medical History:  Diagnosis Date  . Allergic rhinitis 07/04/2012  . Diabetes mellitus type II, uncontrolled ([facility]) 10/08/2019  . Hypertension 10/08/2019  . Hypocalcemia   . Obesity, unspecified 07/04/2012  . Vitamin D deficiency    Review of Systems:   Review of Systems  Constitutional: Negative for fever.  Eyes: Negative for blurred vision and double vision.  Psychiatric/Behavioral: Negative for depression. The patient is not nervous/anxious.      Physical Exam: Vitals:   04/20/20 0950  BP: 125/71  Pulse: 73  SpO2: 98%  Weight: (!) 374 lb 6.4 oz (169.8 kg)   General: NAD, obese , young man, well kept HEENT: [place]/AT, Conjunctiva nl , No goiter Cardiovascular: Normal rate, regular rhythm.  No murmurs, rubs, or gallops Pulmonary : Equal breath sounds, No wheezes, rales, or rhonchi Abdominal: soft, nontender,  bowel sounds present   Assessment & Plan:   See Encounters Tab for problem based charting.  Patient discussed with Dr. Mayford Knife

## 2020-04-20 NOTE — Patient Instructions (Signed)
Thank you, [name] for allowing [place] to provide your care today. Today we discussed high blood pressure and diabetes.    I have ordered the following labs for you:   Lab Orders     Glucose, capillary     Vitamin D (25 hydroxy)     BMP8+Anion Gap     POC Hbg A1C   Tests ordered today:   Referrals ordered today:   Referral Orders  No referral(s) requested today     I have ordered the following medication/changed the following medications:   Stop the following medications: Medications Discontinued During This Encounter  Medication Reason  . insulin glargine (LANTUS SOLOSTAR) 100 UNIT/ML Solostar Pen Reorder  . insulin lispro (HUMALOG KWIKPEN) 100 UNIT/ML KwikPen Reorder     Start the following medications: Meds ordered this encounter  Medications  . insulin glargine (LANTUS SOLOSTAR) 100 UNIT/ML Solostar Pen    Sig: Inject 80 Units into the skin daily.    Dispense:  72 mL    Refill:  0    IM PROGRAM  . insulin lispro (HUMALOG KWIKPEN) 100 UNIT/ML KwikPen    Sig: INJECT 20 UNITS INTO THE SKIN 3 TIMES DAILY.    Dispense:  18 mL    Refill:  0    IM PROGRAM  . metFORMIN (GLUCOPHAGE XR) 500 MG 24 hr tablet    Sig: Take 1 tablet (500 mg total) by mouth daily with breakfast.    Dispense:  30 tablet    Refill:  1    IM program     Follow up: 3 months    Remember: I will follow up with lab results tomorrow.   Should you have any questions or concerns please call the internal medicine clinic at [phone].      Thurmon Fair, M.D. [facility]

## 2020-04-20 NOTE — Assessment & Plan Note (Signed)
Assessment: Hx of vitamin D deficiency, 09/2019. At last visit patient was prescribed Ergocalciferol 50,000 units with plan to transition to 800 mg D3 daily in 6 weeks. He says he hasn't been taking any Vitamin D recently. We will check his level today and prescribe medication once his levels have returned.   Plan: - Vitamin D (25 hydroxy)

## 2020-04-20 NOTE — Assessment & Plan Note (Signed)
Patient on Losartan 50 mg, amlodopine 5 mg, and metoprolol tartrate 25 mg BID. BP at goal .Given he is uninsured alternative medications which would decrease his pill burden are not a option. Encouraged continued weight loss and physical activity to help with blood pressure. He is limiting salt intake.  Assessment: HTN, controlled on regimen above. No changes today, continue current regimen.  Plan:  - Losartan 50 mg daily - Amlodipine 5 mg daily - Metoprolol Tartrate 25 mg BID - BMP

## 2020-04-21 ENCOUNTER — Telehealth: Payer: Self-pay | Admitting: Internal Medicine

## 2020-04-21 ENCOUNTER — Other Ambulatory Visit (HOSPITAL_COMMUNITY): Payer: Self-pay | Admitting: Internal Medicine

## 2020-04-21 DIAGNOSIS — E559 Vitamin D deficiency, unspecified: Secondary | ICD-10-CM

## 2020-04-21 LAB — BMP8+ANION GAP
Anion Gap: 13 mmol/L (ref 10.0–18.0)
BUN/Creatinine Ratio: 11 (ref 9–20)
BUN: 8 mg/dL (ref 6–20)
CO2: 24 mmol/L (ref 20–29)
Calcium: 9.3 mg/dL (ref 8.7–10.2)
Chloride: 103 mmol/L (ref 96–106)
Creatinine, Ser: 0.76 mg/dL (ref 0.76–1.27)
GFR calc Af Amer: 146 mL/min/{1.73_m2} (ref >59)
GFR calc non Af Amer: 126 mL/min/{1.73_m2} (ref >59)
Glucose: 66 mg/dL (ref 65–99)
Potassium: 4.3 mmol/L (ref 3.5–5.2)
Sodium: 140 mmol/L (ref 134–144)

## 2020-04-21 LAB — VITAMIN D 25 HYDROXY (VIT D DEFICIENCY, FRACTURES): Vit D, 25-Hydroxy: 16.5 ng/mL — ABNORMAL LOW (ref 30.0–100.0)

## 2020-04-21 MED ORDER — CHOLECALCIFEROL 25 MCG (1000 UT) PO CAPS: 1000.0000 [IU] | ORAL_CAPSULE | Freq: Every day | ORAL | 1 refills | Status: AC

## 2020-04-21 NOTE — Telephone Encounter (Signed)
Vitamin D level improved but low, will start supplementation and recommend checking in 3 months. Attempted to call patient x2, will send a mychart message.   - Cholecalciferol 25 MCG (1000 UT) capsule; Take 1 capsule (1,000 Units total) by mouth daily.  Dispense: 90 capsule; Refill: 1

## 2020-04-22 ENCOUNTER — Telehealth: Payer: Self-pay | Admitting: Student

## 2020-04-22 ENCOUNTER — Encounter: Payer: Self-pay | Admitting: Emergency Medicine

## 2020-04-22 ENCOUNTER — Ambulatory Visit
Admission: EM | Admit: 2020-04-22 | Discharge: 2020-04-22 | Disposition: A | Discharge status: Home / Self Care | Payer: Self-pay | Attending: Emergency Medicine | Admitting: Emergency Medicine

## 2020-04-22 ENCOUNTER — Other Ambulatory Visit: Payer: Self-pay

## 2020-04-22 DIAGNOSIS — Z1152 Encounter for screening for COVID-19: Secondary | ICD-10-CM

## 2020-04-22 DIAGNOSIS — R52 Pain, unspecified: Secondary | ICD-10-CM

## 2020-04-22 DIAGNOSIS — J069 Acute upper respiratory infection, unspecified: Secondary | ICD-10-CM

## 2020-04-22 MED ORDER — BENZONATATE 100 MG PO CAPS
100.0000 mg | ORAL_CAPSULE | Freq: Three times a day (TID) | ORAL | 0 refills | Status: DC
Start: 2020-04-22 — End: 2020-05-25

## 2020-04-22 MED ORDER — DEXAMETHASONE 4 MG PO TABS: 4.0000 mg | ORAL_TABLET | Freq: Every day | ORAL | 0 refills | Status: AC

## 2020-04-22 NOTE — Discharge Instructions (Signed)
COVID testing ordered.  It will take between 2-7 days for test results.  Someone will contact you regarding abnormal results.   If You Test Positive for COVID-19 (Isolate) Everyone, regardless of vaccination status. Stay home for 5 days. If you have no symptoms or your symptoms are resolving after 5 days, you can leave your house. Continue to wear a mask around others for 5 additional days. If you have a fever, continue to stay home until your fever resolves. If You Were Exposed to Someone with COVID-19 (Quarantine) If you: Have been boosted Wear a mask around others for 10 days. Test on day 5, if possible. If you develop symptoms get a test and stay home. If you: Completed the primary series of vaccine over 6 months ago and are not boosted OR Are unvaccinated Stay home for 5 days. After that continue to wear a mask around others for 5 additional days. If you cant quarantine you must wear a mask for 10 days. Test on day 5 if possible. If you develop symptoms get a test and stay home Tessalon Perles prescribed for cough Decadron was prescribed Use OTC medications like ibuprofen or tylenol as needed fever or pain Use medications daily for symptom relief Call or go to the ED if you have any new or worsening symptoms such as fever, worsening cough, shortness of breath, chest tightness, chest pain, turning blue, changes in mental status, etc..Marland Kitchen

## 2020-04-22 NOTE — Telephone Encounter (Signed)
Called patient to inform him that his vitamin D level returned low, consistent with Vitamin d insufficiency, for which he should take Vitamin D 1000 IU daily until his follow up visit in 3 months. Instructed to continue Metformin for now, although with lower blood glucose on last visit, instructed patient to call to schedule sooner appointment if he has any signs or symptoms of hypoglycemia. Gave call back number.  [name] PGY1 Internal Medicine [phone]

## 2020-04-22 NOTE — ED Provider Notes (Signed)
[place]   [id number] 04/22/20 Arrival Time: 1024   CC: COVID symptoms  SUBJECTIVE: History from: patient.  Stephen Silva is a 25 y.o. male presented to the urgent care for complaint of chills, fever, body aches, cough for the past few days.  Report positive Covid exposure.  Denies sick exposure to  flu or strep.  Denies recent travel.  Has no tried any OTC medication.  Denies alleviating or aggravating factors.  Denies previous symptoms in the past.   Denies fever, chills, fatigue, sinus pain, rhinorrhea, sore throat, SOB, wheezing, chest pain, nausea, changes in bowel or bladder habits.    ROS: As per HPI.  All other pertinent ROS negative.      Past Medical History:  Diagnosis Date  . Allergic rhinitis 07/04/2012  . Diabetes mellitus type II, uncontrolled ([facility]) 10/08/2019  . Hypertension 10/08/2019  . Hypocalcemia   . Obesity, unspecified 07/04/2012  . Vitamin D deficiency    Past Surgical History:  Procedure Laterality Date  . ADENOIDECTOMY     No Known Allergies No current facility-administered medications on file prior to encounter.   Current Outpatient Medications on File Prior to Encounter  Medication Sig Dispense Refill  . amLODipine (NORVASC) 5 MG tablet Take 1 tablet (5 mg total) by mouth daily. 30 tablet 0  . blood glucose meter kit and supplies Dispense based on patient and insurance preference. Use up to four times daily as directed. (FOR ICD-10 E10.9, E11.9). 1 each 5  . Cholecalciferol 25 MCG (1000 UT) capsule Take 1 capsule (1,000 Units total) by mouth daily. 90 capsule 1  . cyclobenzaprine (FLEXERIL) 5 MG tablet Take 1 tablet (5 mg total) by mouth 3 (three) times daily as needed. 30 tablet 0  . glucose blood (RELION PRIME TEST) test strip Use as instructed 100 each 12  . ibuprofen (ADVIL) 800 MG tablet Take 1 tablet (800 mg total) by mouth 3 (three) times daily. Take with food 30 tablet 0  . insulin glargine (LANTUS SOLOSTAR) 100 UNIT/ML  Solostar Pen Inject 80 Units into the skin daily. 72 mL 0  . insulin lispro (HUMALOG KWIKPEN) 100 UNIT/ML KwikPen INJECT 20 UNITS INTO THE SKIN 3 TIMES DAILY. 18 mL 0  . Insulin Pen Needle (PEN NEEDLES) 31G X 8 MM MISC 1 Device by Does not apply route as directed. 90 each 3  . losartan (COZAAR) 50 MG tablet Take 1 tablet (50 mg total) by mouth daily. 30 tablet 0  . metFORMIN (GLUCOPHAGE XR) 500 MG 24 hr tablet Take 1 tablet (500 mg total) by mouth daily with breakfast. 30 tablet 1  . metoprolol tartrate (LOPRESSOR) 25 MG tablet Take 1 tablet (25 mg total) by mouth 2 (two) times daily. 60 tablet 0   Social History   Socioeconomic History  . Marital status: Married    Spouse name: Not on file  . Number of children: Not on file  . Years of education: Not on file  . Highest education level: Not on file  Occupational History  . Occupation: unemployed  Tobacco Use  . Smoking status: Former Smoker    Packs/day: 1.00    Years: 4.00    Pack years: 4.00    Types: Cigarettes    Quit date: 10/31/2018    Years since quitting: 1.4  . Smokeless tobacco: Never Used  Vaping Use  . Vaping Use: Never used  Substance and Sexual Activity  . Alcohol use: Yes    Comment: occasional  . Drug  use: No  . Sexual activity: Not on file  Other Topics Concern  . Not on file  Social History Narrative   Patient is recently married living with his wife. He is currently unemployed but will begin a new job soon and hopes to one day work with [employer].    Social Determinants of Health   Financial Resource Strain: Not on file  Food Insecurity: Not on file  Transportation Needs: Not on file  Physical Activity: Not on file  Stress: Not on file  Social Connections: Not on file  Intimate Partner Violence: Not on file   Family History  Problem Relation Age of Onset  . Diabetes Mother   . Heart disease Mother   . Kidney disease Mother   . Diabetes Father   . Hypertension Father      OBJECTIVE:  Vitals:   04/22/20 1140 04/22/20 1141  BP: 133/80   Pulse: 75   Resp: 18   Temp: 97.7 F (36.5 C)   TempSrc: Oral   SpO2: 96%   Weight:  (!) 374 lb (169.6 kg)  Height:  _0  (1.778 m)     General appearance: alert; appears fatigued, but nontoxic; speaking in full sentences and tolerating own secretions HEENT: NCAT; Ears: EACs clear, TMs pearly gray; Eyes: PERRL.  EOM grossly intact. Sinuses: nontender; Nose: nares patent without rhinorrhea, Throat: oropharynx clear, tonsils non erythematous or enlarged, uvula midline  Neck: supple without LAD Lungs: unlabored respirations, symmetrical air entry; cough: moderate; no respiratory distress; CTAB Heart: regular rate and rhythm.  Radial pulses 2+ symmetrical bilaterally Skin: warm and dry Psychological: alert and cooperative; normal mood and affect  LABS:  No results found for this or any previous visit (from the past 24 hour(s)).   ASSESSMENT & PLAN:  1. Encounter for screening for COVID-19   2. Viral URI with cough   3. Body aches     Meds ordered this encounter  Medications  . benzonatate (TESSALON) 100 MG capsule    Sig: Take 1 capsule (100 mg total) by mouth every 8 (eight) hours.    Dispense:  30 capsule    Refill:  0  . dexamethasone (DECADRON) 4 MG tablet    Sig: Take 1 tablet (4 mg total) by mouth daily for 7 days.    Dispense:  7 tablet    Refill:  0    Discharge Instructions  COVID testing ordered.  It will take between 2-7 days for test results.  Someone will contact you regarding abnormal results.   If You Test Positive for COVID-19 (Isolate) Everyone, regardless of vaccination status. . Stay home for 5 days. . If you have no symptoms or your symptoms are resolving after 5 days, you can leave your house. . Continue to wear a mask around others for 5 additional days. If you have a fever, continue to stay home until your fever resolves. If You Were Exposed to Someone with COVID-19  Samule Dry) If you: Have been boosted . Wear a mask around others for 10 days. . Test on day 5, if possible. If you develop symptoms get a test and stay home. If you: Completed the primary series of vaccine over 6 months ago and are not boosted OR Are unvaccinated . Stay home for 5 days. After that continue to wear a mask around others for 5 additional days. . If you can't quarantine you must wear a mask for 10 days. . Test on day 5 if possible. If you  develop symptoms get a test and stay home Tessalon Perles prescribed for cough Decadron was prescribed Use OTC medications like ibuprofen or tylenol as needed fever or pain Use medications daily for symptom relief Call or go to the ED if you have any new or worsening symptoms such as fever, worsening cough, shortness of breath, chest tightness, chest pain, turning blue, changes in mental status, etc...   Reviewed expectations re: course of current medical issues. Questions answered. Outlined signs and symptoms indicating need for more acute intervention. Patient verbalized understanding. After Visit Summary given.         Emerson Monte, [place] 04/22/20 1156

## 2020-04-22 NOTE — ED Triage Notes (Signed)
C/o chill, pain all over, thinks he may have been exposed to someone with covid

## 2020-04-22 NOTE — Progress Notes (Signed)
[facility] Medicine Clinic Attending  Case discussed with Dr. Steen  At the time of the visit.  We reviewed the resident's history and exam and pertinent patient test results.  I agree with the assessment, diagnosis, and plan of care documented in the resident's note.  

## 2020-04-28 ENCOUNTER — Telehealth: Payer: Self-pay | Admitting: Nutrition

## 2020-04-28 NOTE — Telephone Encounter (Signed)
VM left for mychart visit.

## 2020-04-29 ENCOUNTER — Telehealth: Payer: Self-pay | Admitting: Nutrition

## 2020-04-29 ENCOUNTER — Encounter: Payer: Self-pay | Attending: Family Medicine | Admitting: Nutrition

## 2020-04-29 ENCOUNTER — Other Ambulatory Visit: Payer: Self-pay

## 2020-04-29 DIAGNOSIS — E559 Vitamin D deficiency, unspecified: Secondary | ICD-10-CM | POA: Insufficient documentation

## 2020-04-29 DIAGNOSIS — IMO0002 Reserved for concepts with insufficient information to code with codable children: Secondary | ICD-10-CM

## 2020-04-29 DIAGNOSIS — E118 Type 2 diabetes mellitus with unspecified complications: Secondary | ICD-10-CM | POA: Insufficient documentation

## 2020-04-29 DIAGNOSIS — I10 Essential (primary) hypertension: Secondary | ICD-10-CM | POA: Insufficient documentation

## 2020-04-29 DIAGNOSIS — E1165 Type 2 diabetes mellitus with hyperglycemia: Secondary | ICD-10-CM | POA: Insufficient documentation

## 2020-04-29 NOTE — Telephone Encounter (Signed)
vm left to return call to do mychart visit or phone visit.

## 2020-04-30 ENCOUNTER — Encounter: Payer: Self-pay | Admitting: Student

## 2020-04-30 ENCOUNTER — Encounter: Payer: Self-pay | Admitting: Nutrition

## 2020-04-30 ENCOUNTER — Telehealth: Payer: Self-pay | Admitting: Student

## 2020-04-30 ENCOUNTER — Other Ambulatory Visit: Payer: Self-pay | Admitting: Student

## 2020-04-30 DIAGNOSIS — E1165 Type 2 diabetes mellitus with hyperglycemia: Secondary | ICD-10-CM

## 2020-04-30 LAB — COVID-19, FLU A+B NAA
Influenza A, NAA: NOT DETECTED
Influenza B, NAA: NOT DETECTED
SARS-CoV-2, NAA: NOT DETECTED

## 2020-04-30 MED ORDER — INSULIN LISPRO (1 UNIT DIAL) 100 UNIT/ML (KWIKPEN): PEN_INJECTOR | SUBCUTANEOUS | 0 refills | Status: DC

## 2020-04-30 NOTE — Progress Notes (Signed)
This visit was completed via telephone due to the COVID-19 pandemic.   I spoke with Stephen Silva and verified that I was speaking with the correct person with two patient identifiers (full name and date of birth).   I discussed the limitations related to this kind of visit and the patient is willing to proceed.  Medical Nutrition Therapy:  Appt start time: 1645   end time: 1700  Assessment:  Primary concerns today: Diabetes Type 2, morbid obesity. LIves with his wife.   Just started a job and needs to get paid before he can pay for insurance. He notes he is getting a lot of walking in on his job. Eating a lot more vegetables and healthier foods. Drinking water. A1C down to 6.2% from 13.9% in 09/2019. PCP is Dr. Laddie Aquas. Has done a fantastic job. Lantus 80 units at night and sliding scale of Novolog 20 units with meals. FBS 97-129, BL 125-130's and Bedtdime 120-130's. Now taking Metformin XR 500 mg a day. Tolerating well. He is very motivated, feels great and people are beginning to notice his weight loss. He has strong support from his wife.' Down 1 lb from last visit but feels he gained some back over the holidays. Low VIt D levels. Needs to pick up medication at pharmacy.  Wt Readings from Last 3 Encounters:  04/22/20 (!) 374 lb (169.6 kg)  04/20/20 (!) 374 lb 6.4 oz (169.8 kg)  02/20/20 (!) 375 lb (170.1 kg)   Ht Readings from Last 3 Encounters:  04/22/20 5\' 10"  (1.778 m)  02/20/20 5\' 10"  (1.778 m)  12/17/19 5\' 10"  (1.778 m)   There is no height or weight on file to calculate BMI. @BMIFA @ Facility age limit for growth percentiles is 20 years. Facility age limit for growth percentiles is 20 years..   Lab Results  Component Value Date   HGBA1C 6.2 (A) 04/20/2020   CMP Latest Ref Rng & Units 04/20/2020 10/31/2019 10/10/2019  Glucose 65 - 99 mg/dL 66 94 06/18/2020)  BUN 6 - 20 mg/dL 8 9 6   Creatinine 0.76 - 1.27 mg/dL 06/18/2020 11/02/2019) 10/12/2019  Sodium 134 - 144 mmol/L 140 139 135   Potassium 3.5 - 5.2 mmol/L 4.3 4.3 3.5  Chloride 96 - 106 mmol/L 103 101 103  CO2 20 - 29 mmol/L 24 23 22   Calcium 8.7 - 10.2 mg/dL 9.3 9.6 7.9(L)  Total Protein 6.0 - 8.5 g/dL - 6.8 -  Total Bilirubin 0.0 - 1.2 mg/dL - 829(F -  Alkaline Phos 48 - 121 IU/L - 103 -  AST 0 - 40 IU/L - 22 -  ALT 0 - 44 IU/L - 48(H) -     Preferred Learning Style:    No preference indicated   Learning Readiness:    Ready  Change in progress   MEDICATIONS:   DIETARY INTAKE:  24-hr recall:  B ( AM):  Oatmeal and water L ( PM): Salad, cesear, water Snk ( PM): D ( PM): Leftover salad, 2 chicken wings grilled, water Snk ( PM): Beverages: water  Usual physical activity: ADL   Estimated energy needs: 1800  calories 200 g carbohydrates 135 g protein 50 g fat  Progress Towards Goal(s):  In progress.   Nutritional Diagnosis:  NI-1.5 Excessive energy intake As related to Morbid obesity.  As evidenced by 60 lbs weight gain in the last few months.    Intervention:  Nutrition and Diabetes education provided on My Plate, CHO counting, meal planning, portion sizes, timing of  meals, avoiding snacks between meals unless having a low blood sugar, target ranges for A1C and blood sugars, signs/symptoms and treatment of hyper/hypoglycemia, monitoring blood sugars, taking medications as prescribed, benefits of exercising 30 minutes per day and prevention of complications of DM.   Goals set by patient;  Continue to increase exercise for needed weight loss of 30 minutes 5 days per week Eat meals on time Watch portion sizes Stick to 45 g of CHO per meal. Increase protein at meals. Lose 1-2 lbs per week. Ask MD to reduce your meal time insulin by 10%. .Start medication for low vit D levels.  Teaching Method Utilized:  Visual Auditory Hands on  Handouts given during visit include:  The plate method    Meal plan card  Diabetes instructions.    Barriers to learning/adherence to lifestyle  change: none  Demonstrated degree of understanding via:  Teach Back   Monitoring/Evaluation:  Dietary intake, exercise, , and body weight in 6 month(s).  Refer to counseling. Would recommend  reducing in his meal time insulin by 10%.

## 2020-04-30 NOTE — Patient Instructions (Signed)
  Goals set by patient;  Continue to increase exercise for needed weight loss of 30 minutes 5 days per week Eat meals on time Watch portion sizes Stick to 45 g of CHO per meal. Increase protein at meals. Lose 1-2 lbs per week. Ask MD to reduce your meal time insulin by 10%. Need to start medication for your low VIt D levels.

## 2020-04-30 NOTE — Telephone Encounter (Signed)
Called patient to discuss Stephen Silva's recommendation to decrease mealtime insulin by 10%. Left voicemail that he should decrease Humalog from 20 to 18 units three times daily with meals and call [facility] if he experiences any signs or symptoms of hypoglycemia or is not taking 20 units as suggested. Will also leave mychart message.   Stephen Bayley, MD 04/30/2020, 7:09 PM Pager: [phone]

## 2020-05-20 MED FILL — METFORMIN HCL ER 500 MG TB2: 500 | 30 days supply | Qty: 30 | Fill #1

## 2020-05-20 MED FILL — AMLODIPINE BESYLATE 5 MG TA: 5 | 30 days supply | Qty: 30 | Fill #0

## 2020-05-20 MED FILL — HUMALOG 100 UNITS/ML KWIKPE: 100 | 30 days supply | Qty: 18 | Fill #0

## 2020-05-20 MED FILL — LOSARTAN POTASSIUM 50 MG TA: 50 | 30 days supply | Qty: 30 | Fill #0

## 2020-05-20 MED FILL — METOPROLOL TARTRATE 25 MG T: 25 | 30 days supply | Qty: 60 | Fill #0

## 2020-05-20 MED FILL — LANTUS SOLOSTAR 100 UNITS/M: 100 | 30 days supply | Qty: 24 | Fill #0

## 2020-05-25 ENCOUNTER — Other Ambulatory Visit: Payer: Self-pay

## 2020-05-25 ENCOUNTER — Ambulatory Visit
Admission: EM | Admit: 2020-05-25 | Discharge: 2020-05-25 | Disposition: A | Discharge status: Home / Self Care | Payer: Self-pay | Attending: Emergency Medicine | Admitting: Emergency Medicine

## 2020-05-25 ENCOUNTER — Emergency Department (HOSPITAL_COMMUNITY): Payer: Self-pay

## 2020-05-25 ENCOUNTER — Encounter (HOSPITAL_COMMUNITY): Payer: Self-pay | Admitting: Emergency Medicine

## 2020-05-25 ENCOUNTER — Emergency Department (HOSPITAL_COMMUNITY)
Admission: EM | Admit: 2020-05-25 | Discharge: 2020-05-25 | Disposition: A | Discharge status: Home / Self Care | Payer: Self-pay | Attending: Emergency Medicine | Admitting: Emergency Medicine

## 2020-05-25 DIAGNOSIS — Z87891 Personal history of nicotine dependence: Secondary | ICD-10-CM | POA: Insufficient documentation

## 2020-05-25 DIAGNOSIS — Z79899 Other long term (current) drug therapy: Secondary | ICD-10-CM | POA: Insufficient documentation

## 2020-05-25 DIAGNOSIS — E119 Type 2 diabetes mellitus without complications: Secondary | ICD-10-CM | POA: Insufficient documentation

## 2020-05-25 DIAGNOSIS — I10 Essential (primary) hypertension: Secondary | ICD-10-CM | POA: Insufficient documentation

## 2020-05-25 DIAGNOSIS — Z20822 Contact with and (suspected) exposure to covid-19: Secondary | ICD-10-CM | POA: Insufficient documentation

## 2020-05-25 DIAGNOSIS — R0789 Other chest pain: Secondary | ICD-10-CM

## 2020-05-25 DIAGNOSIS — R072 Precordial pain: Secondary | ICD-10-CM | POA: Insufficient documentation

## 2020-05-25 DIAGNOSIS — Z794 Long term (current) use of insulin: Secondary | ICD-10-CM | POA: Insufficient documentation

## 2020-05-25 DIAGNOSIS — Z7984 Long term (current) use of oral hypoglycemic drugs: Secondary | ICD-10-CM | POA: Insufficient documentation

## 2020-05-25 LAB — BASIC METABOLIC PANEL
Anion gap: 11 (ref 5–15)
BUN: 9 mg/dL (ref 6–20)
CO2: 25 mmol/L (ref 22–32)
Calcium: 8.7 mg/dL — ABNORMAL LOW (ref 8.9–10.3)
Chloride: 102 mmol/L (ref 98–111)
Creatinine, Ser: 0.73 mg/dL (ref 0.61–1.24)
GFR, Estimated: >60 mL/min (ref >60)
Glucose, Bld: 129 mg/dL — ABNORMAL HIGH (ref 70–99)
Potassium: 3.7 mmol/L (ref 3.5–5.1)
Sodium: 138 mmol/L (ref 135–145)

## 2020-05-25 LAB — CBC WITH DIFFERENTIAL/PLATELET
Abs Immature Granulocytes: 0.06 10*3/uL (ref 0.00–0.07)
Basophils Absolute: 0.1 10*3/uL (ref 0.0–0.1)
Basophils Relative: 0 %
Eosinophils Absolute: 0.1 10*3/uL (ref 0.0–0.5)
Eosinophils Relative: 1 %
HCT: 43.6 % (ref 39.0–52.0)
Hemoglobin: 13.6 g/dL (ref 13.0–17.0)
Immature Granulocytes: 1 %
Lymphocytes Relative: 31 %
Lymphs Abs: 4 10*3/uL (ref 0.7–4.0)
MCH: 24.9 pg — ABNORMAL LOW (ref 26.0–34.0)
MCHC: 31.2 g/dL (ref 30.0–36.0)
MCV: 79.7 fL — ABNORMAL LOW (ref 80.0–100.0)
Monocytes Absolute: 0.9 10*3/uL (ref 0.1–1.0)
Monocytes Relative: 7 %
Neutro Abs: 7.6 10*3/uL (ref 1.7–7.7)
Neutrophils Relative %: 60 %
Platelets: 341 10*3/uL (ref 150–400)
RBC: 5.47 MIL/uL (ref 4.22–5.81)
RDW: 14.1 % (ref 11.5–15.5)
WBC: 12.6 10*3/uL — ABNORMAL HIGH (ref 4.0–10.5)
nRBC: 0 % (ref 0.0–0.2)

## 2020-05-25 LAB — LIPASE, BLOOD: Lipase: 35 U/L (ref 11–51)

## 2020-05-25 LAB — TROPONIN I (HIGH SENSITIVITY)
Troponin I (High Sensitivity): 7 ng/L (ref <18)
Troponin I (High Sensitivity): 7 ng/L (ref <18)

## 2020-05-25 LAB — D-DIMER, QUANTITATIVE: D-Dimer, Quant: <0.27 ug/mL-FEU (ref 0.00–0.50)

## 2020-05-25 LAB — POC SARS CORONAVIRUS 2 AG -  ED
SARS Coronavirus 2 Ag: NEGATIVE
SARS Coronavirus 2 Ag: NEGATIVE

## 2020-05-25 MED ORDER — NAPROXEN 500 MG PO TABS: 500.0000 mg | ORAL_TABLET | Freq: Two times a day (BID) | ORAL | 0 refills | Status: AC

## 2020-05-25 MED ORDER — IOHEXOL 350 MG/ML SOLN
150.0000 mL | Freq: Once | INTRAVENOUS | Status: AC | PRN
  Administered 2020-05-25: 125 mL via INTRAVENOUS

## 2020-05-25 MED ORDER — METHOCARBAMOL 500 MG PO TABS: 500.0000 mg | ORAL_TABLET | Freq: Two times a day (BID) | ORAL | 0 refills | Status: AC

## 2020-05-25 NOTE — Discharge Instructions (Signed)
Go to ER for further evaluation .  

## 2020-05-25 NOTE — ED Notes (Signed)
Pt POC Covid negative. Unable to get to cross over into epic.

## 2020-05-25 NOTE — ED Notes (Signed)
Patient is being discharged from the Urgent Care and sent to the Emergency Department via POV . Per Avegno, NP patient is in need of higher level of care due to abnormal EKG. Patient is aware and verbalizes understanding of plan of care.  Vitals:   05/25/20 1115  BP: 129/83  Pulse: (!) 101  Resp: 20  Temp: 98.8 F (37.1 C)  SpO2: 96%

## 2020-05-25 NOTE — ED Provider Notes (Signed)
[facility] EMERGENCY DEPARTMENT Provider Note   CSN: [id number] Arrival date & time: 05/25/20  1235    History Chief Complaint  Patient presents with  . Chest Pain    Stephen Silva is a 25 y.o. male past medical history significant for diabetes, hypertension, obesity who presents for evaluation of chest pain.  Patient states he has had central chest pain x1 day.  Occasionally radiates into his back.  Not radiating to his left arm, L.  Nonexertional nonpleuritic in nature.  No unilateral leg swelling, redness, warmth, history of PE, DVT, recent surgery, mobilization of malignancy.  He has not take anything for symptoms.  No associated cough, shortness of breath, diaphoresis.  He is able to perform his ADLs at home without any chest pain.  No prior symptoms.  States he thinks he does have family history of a "vessel exploding."  Unsure history of AAA or dissections.  Does have history of hypertension, intermittently compliant with his medications.  No headache, lightheadedness, neck pain, neck stiffness, abdominal pain, diarrhea dysuria, paresthesias or weakness.  Denies additional aggravating or alleviating factors.  Current pain is a 6/10.  No reflux type symptoms.  Not aggravated by food.  History obtained from patient and past medical records. No interpreter used.   HPI     Past Medical History:  Diagnosis Date  . Allergic rhinitis 07/04/2012  . Diabetes mellitus type II, uncontrolled ([place]) 10/08/2019  . Hypertension 10/08/2019  . Hypocalcemia   . Obesity, unspecified 07/04/2012  . Vitamin D deficiency     Patient Active Problem List   Diagnosis Date Noted  . Healthcare maintenance 04/20/2020  . Adjustment disorder with depressed mood 11/12/2019  . Transaminitis 11/01/2019  . [place] intertrigo 10/31/2019  . Hypertension 10/31/2019  . Vitamin D deficiency 10/10/2019  . Type II diabetes mellitus ([place]) 10/08/2019  . Obesity due to excess calories 07/04/2012  . Allergic  rhinitis 07/04/2012    Past Surgical History:  Procedure Laterality Date  . ADENOIDECTOMY         Family History  Problem Relation Age of Onset  . Diabetes Mother   . Heart disease Mother   . Kidney disease Mother   . Diabetes Father   . Hypertension Father     Social History   Tobacco Use  . Smoking status: Former Smoker    Packs/day: 1.00    Years: 4.00    Pack years: 4.00    Types: Cigarettes    Quit date: 10/31/2018    Years since quitting: 1.5  . Smokeless tobacco: Never Used  Vaping Use  . Vaping Use: Never used  Substance Use Topics  . Alcohol use: Yes    Comment: occasional  . Drug use: No    Home Medications Prior to Admission medications   Medication Sig Start Date End Date Taking? Authorizing Provider  amLODipine (NORVASC) 5 MG tablet Take 1 tablet (5 mg total) by mouth daily. 02/28/20 03/29/20 Yes Jose Persia, MD  blood glucose meter kit and supplies Dispense based on patient and insurance preference. Use up to four times daily as directed. (FOR ICD-10 E10.9, E11.9). 10/10/19  Yes Johnson, Clanford L, MD  Cholecalciferol 25 MCG (1000 UT) capsule Take 1 capsule (1,000 Units total) by mouth daily. 04/21/20  Yes Madalyn Rob, MD  glucose blood (RELION PRIME TEST) test strip Use as instructed 11/01/19  Yes Jeralyn Bennett, MD  insulin glargine (LANTUS SOLOSTAR) 100 UNIT/ML Solostar Pen Inject 80 Units into the skin daily. 04/20/20 07/19/20  Yes Madalyn Rob, MD  insulin lispro (HUMALOG KWIKPEN) 100 UNIT/ML KwikPen INJECT 18 UNITS INTO THE SKIN 3 TIMES DAILY. 04/30/20  Yes Jeralyn Bennett, MD  Insulin Pen Needle (PEN NEEDLES) 31G X 8 MM MISC 1 Device by Does not apply route as directed. 11/01/19  Yes Jeralyn Bennett, MD  losartan (COZAAR) 50 MG tablet Take 1 tablet (50 mg total) by mouth daily. 02/28/20 03/29/20 Yes Jose Persia, MD  metFORMIN (GLUCOPHAGE XR) 500 MG 24 hr tablet Take 1 tablet (500 mg total) by mouth daily with breakfast. 04/20/20 04/20/21 Yes Madalyn Rob, MD  methocarbamol (ROBAXIN) 500 MG tablet Take 1 tablet (500 mg total) by mouth 2 (two) times daily. 05/25/20  Yes Lurlie Wigen A, PA-C  metoprolol tartrate (LOPRESSOR) 25 MG tablet Take 1 tablet (25 mg total) by mouth 2 (two) times daily. 02/28/20 03/29/20 Yes Jose Persia, MD  naproxen (NAPROSYN) 500 MG tablet Take 1 tablet (500 mg total) by mouth 2 (two) times daily. 05/25/20  Yes Adamarys Shall A, PA-C    Allergies    Patient has no known allergies.  Review of Systems   Review of Systems  Constitutional: Negative.   HENT: Negative.   Respiratory: Negative.   Cardiovascular: Positive for chest pain. Negative for palpitations and leg swelling.  Gastrointestinal: Negative.   Genitourinary: Negative.   Musculoskeletal: Negative.   Skin: Negative.   Neurological: Negative.   All other systems reviewed and are negative.   Physical Exam Updated Vital Signs BP 111/69   Pulse 81   Temp 98 F (36.7 C) (Oral)   Resp (!) 21   Ht 5' 10"  (1.778 m)   Wt (!) 169.6 kg   SpO2 100%   BMI 53.66 kg/m   Physical Exam Vitals and nursing note reviewed.  Constitutional:      General: He is not in acute distress.    Appearance: He is well-developed and well-nourished. He is obese. He is not ill-appearing, toxic-appearing or diaphoretic.  HENT:     Head: Normocephalic and atraumatic.  Eyes:     Pupils: Pupils are equal, round, and reactive to light.  Cardiovascular:     Rate and Rhythm: Normal rate and regular rhythm.     Pulses:          Radial pulses are 2+ on the right side and 2+ on the left side.       Dorsalis pedis pulses are 2+ on the right side and 2+ on the left side.     Heart sounds: Normal heart sounds.  Pulmonary:     Effort: Pulmonary effort is normal. No respiratory distress.     Breath sounds: Normal breath sounds.     Comments: Speaks in full sentences without difficulty. Clear bilaterally Chest:     Comments: Equal rise and fall to chest wall. No  tenderness, step offs Abdominal:     General: Bowel sounds are normal. There is no distension.     Palpations: Abdomen is soft.     Comments: Soft non tender without rebound or guarding. No midline pulsatile abd wall mass  Musculoskeletal:        General: Normal range of motion.     Cervical back: Normal range of motion and neck supple.     Comments: Moves all 4 extremities without difficulty. Compartments soft. Homans negative. No bony tenderness  Skin:    General: Skin is warm and dry.     Capillary Refill: Capillary refill takes less than 2 seconds.  Comments: No edema, erythema, warmth. Tactile temp to extremities  Neurological:     General: No focal deficit present.     Mental Status: He is alert and oriented to person, place, and time.     Comments: Cn 2-12 grossly intact Ambulatory without difficulty  Psychiatric:        Mood and Affect: Mood and affect normal.     ED Results / Procedures / Treatments   Labs (all labs ordered are listed, but only abnormal results are displayed) Labs Reviewed  CBC WITH DIFFERENTIAL/PLATELET - Abnormal; Notable for the following components:      Result Value   WBC 12.6 (*)    MCV 79.7 (*)    MCH 24.9 (*)    All other components within normal limits  BASIC METABOLIC PANEL - Abnormal; Notable for the following components:   Glucose, Bld 129 (*)    Calcium 8.7 (*)    All other components within normal limits  LIPASE, BLOOD  D-DIMER, QUANTITATIVE (NOT AT [facility])  POC SARS CORONAVIRUS 2 AG -  ED  TROPONIN I (HIGH SENSITIVITY)  TROPONIN I (HIGH SENSITIVITY)    EKG None  Radiology DG Chest 2 View  Result Date: 05/25/2020 CLINICAL DATA:  Central chest pain, headache, negative COVID EXAM: CHEST - 2 VIEW COMPARISON:  04/02/2019 FINDINGS: The heart size and mediastinal contours are within normal limits. Both lungs are clear. The visualized skeletal structures are unremarkable. IMPRESSION: No acute abnormality of the lungs. Electronically  Signed   By: Eddie Candle M.D.   On: 05/25/2020 14:23   CT Angio Chest/Abd/Pel for Dissection W and/or W/WO  Result Date: 05/25/2020 CLINICAL DATA:  Tachycardia, headache, neck pain radiating to back EXAM: CT ANGIOGRAPHY CHEST, ABDOMEN AND PELVIS TECHNIQUE: Non-contrast CT of the chest was initially obtained. Multidetector CT imaging through the chest, abdomen and pelvis was performed using the standard protocol during bolus administration of intravenous contrast. Multiplanar reconstructed images and MIPs were obtained and reviewed to evaluate the vascular anatomy. CONTRAST:  17m OMNIPAQUE IOHEXOL 350 MG/ML SOLN COMPARISON:  05/25/2020 FINDINGS: CTA CHEST FINDINGS Cardiovascular: This is a technically adequate evaluation of the thoracic aorta. No evidence of aneurysm or dissection. The heart is unremarkable without pericardial effusion. There is insufficient contrast enhancement of the pulmonary vasculature to assess for pulmonary emboli. Mediastinum/Nodes: No enlarged mediastinal, hilar, or axillary lymph nodes. Thyroid gland, trachea, and esophagus demonstrate no significant findings. Lungs/Pleura: No acute airspace disease, effusion, or pneumothorax. Central airways are patent. Musculoskeletal: No acute or destructive bony lesions. Reconstructed images demonstrate no additional findings. Review of the MIP images confirms the above findings. CTA ABDOMEN AND PELVIS FINDINGS VASCULAR Evaluation of the intra-abdominal arterial structures is limited by the timing of contrast bolus as well as patient body habitus. Aorta: Normal caliber aorta without aneurysm, dissection, vasculitis or significant stenosis. Celiac: Patent without evidence of aneurysm, dissection, vasculitis or significant stenosis. SMA: Patent without evidence of aneurysm, dissection, vasculitis or significant stenosis. Renals: Both renal arteries are patent without evidence of aneurysm, dissection, vasculitis, fibromuscular dysplasia or  significant stenosis. IMA: Patent without evidence of aneurysm, dissection, vasculitis or significant stenosis. Inflow: Patent without evidence of aneurysm, dissection, vasculitis or significant stenosis. Veins: No obvious venous abnormality within the limitations of this arterial phase study. Review of the MIP images confirms the above findings. NON-VASCULAR Hepatobiliary: Mild diffuse hepatic steatosis. No focal abnormality. The gallbladder is unremarkable. Pancreas: Unremarkable. No pancreatic ductal dilatation or surrounding inflammatory changes. Spleen: Normal in size without focal abnormality.  Adrenals/Urinary Tract: Adrenal glands are unremarkable. Kidneys are normal, without renal calculi, focal lesion, or hydronephrosis. Bladder is unremarkable. Stomach/Bowel: No bowel obstruction or ileus. Normal appendix right lower quadrant. No bowel wall thickening or inflammatory change. Lymphatic: No pathologic adenopathy. Reproductive: Prostate is unremarkable. Other: No free fluid or free gas. Small fat containing umbilical hernia. Musculoskeletal: No acute or destructive bony lesions. Reconstructed images demonstrate no additional findings. Review of the MIP images confirms the above findings. IMPRESSION: 1. No evidence of thoracic or abdominal aortic aneurysm or dissection. 2. No acute intrathoracic, intra-abdominal, or intrapelvic process. 3. Mild hepatic steatosis. Electronically Signed   By: Randa Ngo M.D.   On: 05/25/2020 17:11    Procedures Procedures   Medications Ordered in ED Medications  iohexol (OMNIPAQUE) 350 MG/ML injection 150 mL (125 mLs Intravenous Contrast Given 05/25/20 1648)    ED Course  I have reviewed the triage vital signs and the nursing notes.  Pertinent labs & imaging results that were available during my care of the patient were reviewed by me and considered in my medical decision making (see chart for details).  25 year old presents for evaluation of CP. Non exertional  or pleuritic in nature. No evidence of DVT on exam. No Cough, SOB. Unable to reproduce pain on exam. Does radiate into back, some soft BP here in ED. Abd soft, non tender, no midline pulsatile abd mass. Plan on labs, imaging and reassess.  Labs and imaging personally reviewed and interpreted:  CBC leukocytosis at 12.6 BMP with mild hyperglycemia at 129 no additional  Electrolyte, renal or liver abnormality Ddimer <0.27 Lipase 35 Trop 7---7 Flat COVID negative DG chest without acute findings. CT dissection without acute findings  Patient reassessed. States CP resolved without intervention here in ED however he now has diffuse lower back pain. Able to reproduce pain on exam. No hx of IVDU, bowel or bladder incontinence saddle paresthesias. Likely MSK in etiology.  Chest pain is not likely of cardiac or pulmonary etiology d/t presentation, PERC negative, VSS, no tracheal deviation, no JVD or new murmur, RRR, breath sounds equal bilaterally, EKG without acute abnormalities, negative troponin, and negative CXR. Pt has been advised to return to the ED if CP becomes exertional, associated with diaphoresis or nausea, radiates to left jaw/arm, worsens or becomes concerning in any way. Pt appears reliable for follow up and is agreeable to discharge.   Patient work-up reassuring here in the ED.  Low suspicion for acute bacterial pneumonia, PE, dissection, ACS as cause of his symptoms.  Discussed Intermatic management outpatient.  He will return for any worsening symptoms  The patient has been appropriately medically screened and/or stabilized in the ED. I have low suspicion for any other emergent medical condition which would require further screening, evaluation or treatment in the ED or require inpatient management.  Patient is hemodynamically stable and in no acute distress.  Patient able to ambulate in department prior to ED.  Evaluation does not show acute pathology that would require ongoing or  additional emergent interventions while in the emergency department or further inpatient treatment.  I have discussed the diagnosis with the patient and answered all questions.  Pain is been managed while in the emergency department and patient has no further complaints prior to discharge.  Patient is comfortable with plan discussed in room and is stable for discharge at this time.  I have discussed strict return precautions for returning to the emergency department.  Patient was encouraged to follow-up with PCP/specialist  refer to at discharge.    MDM Rules/Calculators/A&P                           Final Clinical Impression(s) / ED Diagnoses Final diagnoses:  Precordial pain    Rx / DC Orders ED Discharge Orders         Ordered    naproxen (NAPROSYN) 500 MG tablet  2 times daily        05/25/20 1738    methocarbamol (ROBAXIN) 500 MG tablet  2 times daily        05/25/20 1738           Lizandra Zakrzewski A, PA-C 05/25/20 1738    Davonna Belling, MD 05/26/20 1015

## 2020-05-25 NOTE — ED Triage Notes (Signed)
Pt c/o of central cp with a headache since yesterday.  Was seen by urgent today and told to come to ER for eval

## 2020-05-25 NOTE — Discharge Instructions (Addendum)
Follow up with PCP for reevalution

## 2020-05-25 NOTE — ED Provider Notes (Addendum)
[place] [facility]   [id number] 05/25/20 Arrival Time: 70   CC: CHEST PAIN  SUBJECTIVE:  Stephen Silva is a 25 y.o. male presented to the urgent care with a complaint of headache, chest pain, palpitation and neck pain that started yesterday.  Denies a precipitating event, trauma, recent lower respiratory tract, or strenuous upper body activities.  Localizes chest pain to the substernal region.  Describes as stable there is intermittent, achy and burning in character.  Rib pain at 8/10.   Has not tried OTC medication.  Denies alleviating or aggravating factors.  Denies radiates symptoms.  Denies symptoms in the past.  Denies fever, chills, lightheadedness, dizziness, SOB, nausea, vomiting, abdominal pain, changes in bowel or bladder habits, diaphoresis, numbness/tingling in extremities, peripheral edema, or anxiety.    Denies close relatives with cardiac hx  Previous cardiac testing: none.  ROS: As per HPI.  All other pertinent ROS negative.    Past Medical History:  Diagnosis Date  . Allergic rhinitis 07/04/2012  . Diabetes mellitus type II, uncontrolled ([place]) 10/08/2019  . Hypertension 10/08/2019  . Hypocalcemia   . Obesity, unspecified 07/04/2012  . Vitamin D deficiency    Past Surgical History:  Procedure Laterality Date  . ADENOIDECTOMY     No Known Allergies No current facility-administered medications on file prior to encounter.   Current Outpatient Medications on File Prior to Encounter  Medication Sig Dispense Refill  . amLODipine (NORVASC) 5 MG tablet Take 1 tablet (5 mg total) by mouth daily. 30 tablet 0  . benzonatate (TESSALON) 100 MG capsule Take 1 capsule (100 mg total) by mouth every 8 (eight) hours. 30 capsule 0  . blood glucose meter kit and supplies Dispense based on patient and insurance preference. Use up to four times daily as directed. (FOR ICD-10 E10.9, E11.9). 1 each 5  . Cholecalciferol 25 MCG (1000 UT) capsule Take 1 capsule (1,000 Units total)  by mouth daily. 90 capsule 1  . cyclobenzaprine (FLEXERIL) 5 MG tablet Take 1 tablet (5 mg total) by mouth 3 (three) times daily as needed. 30 tablet 0  . glucose blood (RELION PRIME TEST) test strip Use as instructed 100 each 12  . ibuprofen (ADVIL) 800 MG tablet Take 1 tablet (800 mg total) by mouth 3 (three) times daily. Take with food 30 tablet 0  . insulin glargine (LANTUS SOLOSTAR) 100 UNIT/ML Solostar Pen Inject 80 Units into the skin daily. 72 mL 0  . insulin lispro (HUMALOG KWIKPEN) 100 UNIT/ML KwikPen INJECT 18 UNITS INTO THE SKIN 3 TIMES DAILY. 15 mL 0  . Insulin Pen Needle (PEN NEEDLES) 31G X 8 MM MISC 1 Device by Does not apply route as directed. 90 each 3  . losartan (COZAAR) 50 MG tablet Take 1 tablet (50 mg total) by mouth daily. 30 tablet 0  . metFORMIN (GLUCOPHAGE XR) 500 MG 24 hr tablet Take 1 tablet (500 mg total) by mouth daily with breakfast. 30 tablet 1  . metoprolol tartrate (LOPRESSOR) 25 MG tablet Take 1 tablet (25 mg total) by mouth 2 (two) times daily. 60 tablet 0   Social History   Socioeconomic History  . Marital status: Married    Spouse name: Not on file  . Number of children: Not on file  . Years of education: Not on file  . Highest education level: Not on file  Occupational History  . Occupation: unemployed  Tobacco Use  . Smoking status: Former Smoker    Packs/day: 1.00    Years:  4.00    Pack years: 4.00    Types: Cigarettes    Quit date: 10/31/2018    Years since quitting: 1.5  . Smokeless tobacco: Never Used  Vaping Use  . Vaping Use: Never used  Substance and Sexual Activity  . Alcohol use: Yes    Comment: occasional  . Drug use: No  . Sexual activity: Not on file  Other Topics Concern  . Not on file  Social History Narrative   Patient is recently married living with his wife. He is currently unemployed but will begin a new job soon and hopes to one day work with [employer].    Social Determinants of Health   Financial Resource  Strain: Not on file  Food Insecurity: Not on file  Transportation Needs: Not on file  Physical Activity: Not on file  Stress: Not on file  Social Connections: Not on file  Intimate Partner Violence: Not on file   Family History  Problem Relation Age of Onset  . Diabetes Mother   . Heart disease Mother   . Kidney disease Mother   . Diabetes Father   . Hypertension Father      OBJECTIVE:  Vitals:   05/25/20 1115  BP: 129/83  Pulse: (!) 101  Resp: 20  Temp: 98.8 F (37.1 C)  TempSrc: Oral  SpO2: 96%    General appearance: alert; no distress Eyes: PERRLA; EOMI; conjunctiva normal HENT: normocephalic; atraumatic Neck: supple Lungs: clear to auscultation bilaterally without adventitious breath sounds Heart: regular rate and rhythm.  Clear S1 and S2 without rubs, gallops, or murmur. Chest Wall:  no thrills Abdomen: soft, non-tender; bowel sounds normal; no masses or organomegaly; no guarding or rebound tenderness Extremities: no cyanosis or edema; symmetrical with no gross deformities Skin: warm and dry Psychological: alert and cooperative; normal mood and affect  ECG: Orders placed or performed during the hospital encounter of 05/25/20  . ED EKG  . ED EKG    EKG show normal sinus rhythm with lateral infarct and T wave abnormality.  No narrowing or widening of the QRS complexes.  Reviewed EKG with Dr Lanny Cramp who recommend sending patient to ER  LABS:  Results for orders placed or performed during the hospital encounter of 04/22/20  Covid-19, Flu A+B ([facility])   Specimen: Nasopharyngeal   Naso  Result Value Ref Range   SARS-CoV-2, NAA Not Detected Not Detected   Influenza A, NAA Not Detected Not Detected   Influenza B, NAA Not Detected Not Detected   Labs Reviewed - No data to display  DIAGNOSTIC STUDIES:  No results found.   ASSESSMENT & PLAN:  1. Other chest pain     No orders of the defined types were placed in this encounter.   Patient was advised  to go to ER for further evaluation.  Advised to go by ambulance, patient declined  Chest pain precautions given. Reviewed expectations re: course of current medical issues. Questions answered. Outlined signs and symptoms indicating need for more acute intervention. Patient verbalized understanding. After Visit Summary given.       Emerson Monte, FNP 05/25/20 1227

## 2020-05-25 NOTE — ED Triage Notes (Signed)
Patient presents to Urgent Care with complaints of heart pounding, headache, neck pain that radiates to back since yesterday. Pt states he has a hx of anxiety and unsure if symptoms are related to a panic attack. Pt states his provider instructed pt to monitor symptoms if not improving to follow-up with UC or PCP.

## 2020-06-18 ENCOUNTER — Other Ambulatory Visit: Payer: Self-pay | Admitting: Internal Medicine

## 2020-06-18 ENCOUNTER — Other Ambulatory Visit: Payer: Self-pay | Admitting: Student

## 2020-06-18 DIAGNOSIS — E1165 Type 2 diabetes mellitus with hyperglycemia: Secondary | ICD-10-CM

## 2020-06-18 DIAGNOSIS — I10 Essential (primary) hypertension: Secondary | ICD-10-CM

## 2020-06-18 MED ORDER — LOSARTAN POTASSIUM 50 MG PO TABS: 50.0000 mg | ORAL_TABLET | Freq: Every day | ORAL | 2 refills | Status: DC

## 2020-06-18 MED ORDER — METOPROLOL TARTRATE 25 MG PO TABS: 25.0000 mg | ORAL_TABLET | Freq: Two times a day (BID) | ORAL | 2 refills | Status: DC

## 2020-06-18 MED ORDER — METFORMIN HCL ER 500 MG PO TB24: 500.0000 mg | ORAL_TABLET | Freq: Every day | ORAL | 3 refills | Status: DC

## 2020-06-18 MED ORDER — AMLODIPINE BESYLATE 5 MG PO TABS
5.0000 mg | ORAL_TABLET | Freq: Every day | ORAL | 2 refills | Status: DC
Start: 2020-06-18 — End: 2020-06-18

## 2020-06-18 MED ORDER — INSULIN LISPRO (1 UNIT DIAL) 100 UNIT/ML (KWIKPEN)
PEN_INJECTOR | SUBCUTANEOUS | 2 refills | Status: DC
Start: 2020-06-18 — End: 2020-06-18

## 2020-06-18 MED FILL — METOPROLOL TARTRATE 25 MG T: 25 | 30 days supply | Qty: 60 | Fill #0

## 2020-06-18 MED FILL — LANTUS SOLOSTAR 100 UNITS/M: 100 | 30 days supply | Qty: 24 | Fill #1

## 2020-06-18 MED FILL — AMLODIPINE BESYLATE 5 MG TA: 5 | 30 days supply | Qty: 30 | Fill #0

## 2020-06-18 MED FILL — METFORMIN HCL ER 500 MG TB2: 500 | 30 days supply | Qty: 30 | Fill #0

## 2020-06-18 MED FILL — LOSARTAN POTASSIUM 50 MG TA: 50 | 30 days supply | Qty: 30 | Fill #0

## 2020-06-18 MED FILL — UNIFINE PENTIPS 8MM 31G: 31G X 8 MM | 25 days supply | Qty: 100 | Fill #1

## 2020-06-18 MED FILL — HUMALOG 100 UNITS/ML KWIKPE: 100 | 30 days supply | Qty: 18 | Fill #0

## 2020-07-18 ENCOUNTER — Other Ambulatory Visit (HOSPITAL_COMMUNITY): Payer: Self-pay

## 2020-07-29 ENCOUNTER — Encounter: Payer: Self-pay | Admitting: Nutrition

## 2020-07-29 ENCOUNTER — Encounter: Payer: Self-pay | Attending: Family Medicine | Admitting: Nutrition

## 2020-07-29 ENCOUNTER — Other Ambulatory Visit: Payer: Self-pay

## 2020-07-29 VITALS — Ht 70.0 in | Wt 380.0 lb

## 2020-07-29 DIAGNOSIS — E1165 Type 2 diabetes mellitus with hyperglycemia: Secondary | ICD-10-CM | POA: Insufficient documentation

## 2020-07-29 DIAGNOSIS — E559 Vitamin D deficiency, unspecified: Secondary | ICD-10-CM

## 2020-07-29 DIAGNOSIS — E66813 Obesity, class 3: Secondary | ICD-10-CM

## 2020-07-29 DIAGNOSIS — I10 Essential (primary) hypertension: Secondary | ICD-10-CM

## 2020-07-29 DIAGNOSIS — E118 Type 2 diabetes mellitus with unspecified complications: Secondary | ICD-10-CM | POA: Insufficient documentation

## 2020-07-29 DIAGNOSIS — IMO0002 Reserved for concepts with insufficient information to code with codable children: Secondary | ICD-10-CM

## 2020-07-29 NOTE — Patient Instructions (Signed)
Goals  Talk to MD about referral to therapist for depression and medication. Lose 1-2 lbs per week Increase intake more lower carb vegetables Cut down on starchy vegetables Increase fresh seafood balked and broiled/grilled Avoid snacks and exercise instead.

## 2020-07-29 NOTE — Progress Notes (Signed)
This visit was completed via telephone due to the COVID-19 pandemic.   I spoke with Stephen Silva and verified that I was speaking with the correct person with two patient identifiers (full name and date of birth).   I discussed the limitations related to this kind of visit and the patient is willing to proceed.  Medical Nutrition Therapy:  Appt start time: 1645   end time: 1700  Assessment:  Primary concerns today: Diabetes Type 2, morbid obesity. LIves with his wife.   Lantus 80 units,  Novolog of 18 units with meals.  FBS; 110-120's mg/dl  Before lunch: 98 mg/dl, before supper 732 mg Bedtime: 106 mg/dl. Metformin 500 mg daily, Lantus 80 units at night and Humalog 20 units with meals plus sliding scale.  BS are doing very well.  Wt Readings from Last 3 Encounters:  07/29/20 (!) 380 lb (172.4 kg)  05/25/20 (!) 374 lb (169.6 kg)  04/22/20 (!) 374 lb (169.6 kg)   Ht Readings from Last 3 Encounters:  07/29/20 5\' 10"  (1.778 m)  05/25/20 5\' 10"  (1.778 m)  04/22/20 5\' 10"  (1.778 m)   Body mass index is 54.52 kg/m. @BMIFA @ Facility age limit for growth percentiles is 20 years. Facility age limit for growth percentiles is 20 years..   Lab Results  Component Value Date   HGBA1C 6.2 (A) 04/20/2020   CMP Latest Ref Rng & Units 05/25/2020 04/20/2020 10/31/2019  Glucose 70 - 99 mg/dL 06/18/2020) 66 94  BUN 6 - 20 mg/dL 9 8 9   Creatinine 0.61 - 1.24 mg/dL 07/23/2020 06/18/2020 11/02/2019)  Sodium 135 - 145 mmol/L 138 140 139  Potassium 3.5 - 5.1 mmol/L 3.7 4.3 4.3  Chloride 98 - 111 mmol/L 102 103 101  CO2 22 - 32 mmol/L 25 24 23   Calcium 8.9 - 10.3 mg/dL 202(R) 9.3 9.6  Total Protein 6.0 - 8.5 g/dL - - 6.8  Total Bilirubin 0.0 - 1.2 mg/dL - -  Alkaline Phos 48 - 121 IU/L - - 103  AST 0 - 40 IU/L - - 22  ALT 0 - 44 IU/L - - 48(H)     Preferred Learning Style:    No preference indicated   Learning Readiness:    Ready  Change in progress   MEDICATIONS:   DIETARY INTAKE:  24-hr  recall:  B ( AM):  Oatmeal and water L ( PM): Salad, cesear, water Snk ( PM): D ( PM): Leftover salad, 2 chicken wings grilled, water Snk ( PM): Beverages: water  Usual physical activity: ADL   Estimated energy needs: 1800  calories 200 g carbohydrates 135 g protein 50 g fat  Progress Towards Goal(s):  In progress.   Nutritional Diagnosis:  NI-1.5 Excessive energy intake As related to Morbid obesity.  As evidenced by 60 lbs weight gain in the last few months.    Intervention:  Nutrition and Diabetes education provided on My Plate, CHO counting, meal planning, portion sizes, timing of meals, avoiding snacks between meals unless having a low blood sugar, target ranges for A1C and blood sugars, signs/symptoms and treatment of hyper/hypoglycemia, monitoring blood sugars, taking medications as prescribed, benefits of exercising 30 minutes per day and prevention of complications of DM.  Goals  Talk to MD about referral to therapist for depression and medication. Lose 1-2 lbs per week Increase intake more lower carb vegetables Cut down on starchy vegetables Increase fresh seafood balked and broiled/grilled Avoid snacks and exercise instead.  Teaching Method Utilized:  4.27  Hands on  Handouts given during visit include:  The plate method    Meal plan card  Diabetes instructions.    Barriers to learning/adherence to lifestyle change: none  Demonstrated degree of understanding via:  Teach Back   Monitoring/Evaluation:  Dietary intake, exercise, , and body weight in 6 month(s).  Refer to counseling. Would recommend  reducing in his meal time insulin by 10%.

## 2020-08-14 ENCOUNTER — Other Ambulatory Visit (HOSPITAL_COMMUNITY): Payer: Self-pay

## 2020-08-18 ENCOUNTER — Encounter: Payer: Self-pay | Admitting: Nutrition

## 2020-08-21 ENCOUNTER — Other Ambulatory Visit (HOSPITAL_COMMUNITY): Payer: Self-pay

## 2020-08-21 MED FILL — Amlodipine Besylate Tab 5 MG (Base Equivalent): ORAL | 30 days supply | Qty: 30 | Fill #0 | Status: AC

## 2020-08-21 MED FILL — Losartan Potassium Tab 50 MG: ORAL | 30 days supply | Qty: 30 | Fill #0 | Status: AC

## 2020-08-21 MED FILL — Insulin Glargine Soln Pen-Injector 100 Unit/ML: SUBCUTANEOUS | 30 days supply | Qty: 24 | Fill #0 | Status: AC

## 2020-08-27 ENCOUNTER — Other Ambulatory Visit (HOSPITAL_COMMUNITY): Payer: Self-pay

## 2020-09-10 ENCOUNTER — Encounter: Payer: Self-pay | Admitting: *Deleted

## 2020-09-30 ENCOUNTER — Other Ambulatory Visit (HOSPITAL_COMMUNITY): Payer: Self-pay

## 2020-09-30 ENCOUNTER — Other Ambulatory Visit: Payer: Self-pay | Admitting: Internal Medicine

## 2020-09-30 DIAGNOSIS — E1165 Type 2 diabetes mellitus with hyperglycemia: Secondary | ICD-10-CM

## 2020-09-30 MED ORDER — LANTUS SOLOSTAR 100 UNIT/ML ~~LOC~~ SOPN
80.0000 [IU] | PEN_INJECTOR | Freq: Every day | SUBCUTANEOUS | 0 refills | Status: DC
  Filled 2020-09-30: qty 24, 30d supply, fill #0
  Filled 2020-10-22 – 2020-11-05 (×2): qty 24, 30d supply, fill #1
  Filled 2020-11-30: qty 24, 30d supply, fill #2

## 2020-09-30 MED FILL — Insulin Lispro Soln Pen-injector 100 Unit/ML (1 Unit Dial): SUBCUTANEOUS | 30 days supply | Qty: 18 | Fill #0 | Status: AC

## 2020-09-30 MED FILL — Amlodipine Besylate Tab 5 MG (Base Equivalent): ORAL | 30 days supply | Qty: 30 | Fill #1 | Status: AC

## 2020-09-30 MED FILL — Losartan Potassium Tab 50 MG: ORAL | 30 days supply | Qty: 30 | Fill #1 | Status: AC

## 2020-09-30 MED FILL — Metformin HCl Tab ER 24HR 500 MG: ORAL | 30 days supply | Qty: 30 | Fill #0 | Status: AC

## 2020-10-08 ENCOUNTER — Other Ambulatory Visit (HOSPITAL_COMMUNITY): Payer: Self-pay

## 2020-10-20 ENCOUNTER — Encounter: Payer: Self-pay | Admitting: *Deleted

## 2020-10-22 ENCOUNTER — Other Ambulatory Visit (HOSPITAL_COMMUNITY): Payer: Self-pay

## 2020-10-22 ENCOUNTER — Other Ambulatory Visit: Payer: Self-pay | Admitting: Student

## 2020-10-22 DIAGNOSIS — I10 Essential (primary) hypertension: Secondary | ICD-10-CM

## 2020-10-22 MED FILL — Insulin Lispro Soln Pen-injector 100 Unit/ML (1 Unit Dial): SUBCUTANEOUS | 30 days supply | Qty: 18 | Fill #1 | Status: CN

## 2020-10-22 MED FILL — Metformin HCl Tab ER 24HR 500 MG: ORAL | 30 days supply | Qty: 30 | Fill #1 | Status: CN

## 2020-10-23 ENCOUNTER — Other Ambulatory Visit (HOSPITAL_COMMUNITY): Payer: Self-pay

## 2020-10-23 MED ORDER — LOSARTAN POTASSIUM 50 MG PO TABS
50.0000 mg | ORAL_TABLET | Freq: Every day | ORAL | 2 refills | Status: DC
  Filled 2020-10-23 – 2020-11-05 (×2): qty 30, 30d supply, fill #0
  Filled 2020-11-30: qty 30, 30d supply, fill #1
  Filled 2020-12-31: qty 30, 30d supply, fill #2

## 2020-10-23 MED ORDER — AMLODIPINE BESYLATE 5 MG PO TABS
5.0000 mg | ORAL_TABLET | Freq: Every day | ORAL | 2 refills | Status: DC
  Filled 2020-10-23 – 2020-11-05 (×2): qty 30, 30d supply, fill #0
  Filled 2020-11-30: qty 30, 30d supply, fill #1
  Filled 2020-12-31: qty 30, 30d supply, fill #2

## 2020-10-30 ENCOUNTER — Other Ambulatory Visit (HOSPITAL_COMMUNITY): Payer: Self-pay

## 2020-11-03 ENCOUNTER — Other Ambulatory Visit (HOSPITAL_COMMUNITY): Payer: Self-pay

## 2020-11-04 ENCOUNTER — Ambulatory Visit: Payer: Self-pay | Admitting: Nutrition

## 2020-11-04 ENCOUNTER — Other Ambulatory Visit (HOSPITAL_COMMUNITY): Payer: Self-pay

## 2020-11-05 ENCOUNTER — Other Ambulatory Visit (HOSPITAL_COMMUNITY): Payer: Self-pay

## 2020-11-05 MED ORDER — INSULIN PEN NEEDLE 31G X 8 MM MISC
0 refills | Status: AC
  Filled 2020-11-05: qty 100, 30d supply, fill #0

## 2020-11-05 MED FILL — Insulin Lispro Soln Pen-injector 100 Unit/ML (1 Unit Dial): SUBCUTANEOUS | 30 days supply | Qty: 18 | Fill #1 | Status: AC

## 2020-11-05 MED FILL — Metformin HCl Tab ER 24HR 500 MG: ORAL | 30 days supply | Qty: 30 | Fill #1 | Status: AC

## 2020-11-30 ENCOUNTER — Other Ambulatory Visit (HOSPITAL_COMMUNITY): Payer: Self-pay

## 2020-11-30 ENCOUNTER — Other Ambulatory Visit: Payer: Self-pay | Admitting: Student

## 2020-11-30 DIAGNOSIS — E1165 Type 2 diabetes mellitus with hyperglycemia: Secondary | ICD-10-CM

## 2020-11-30 MED FILL — Metoprolol Tartrate Tab 25 MG: ORAL | 30 days supply | Qty: 60 | Fill #0 | Status: AC

## 2020-11-30 MED FILL — Metformin HCl Tab ER 24HR 500 MG: ORAL | 30 days supply | Qty: 30 | Fill #2 | Status: AC

## 2020-12-01 MED ORDER — INSULIN LISPRO (1 UNIT DIAL) 100 UNIT/ML (KWIKPEN)
PEN_INJECTOR | SUBCUTANEOUS | 2 refills | Status: DC
Start: 2020-12-01 — End: 2020-12-02
  Filled 2020-12-01: qty 18, fill #0

## 2020-12-02 ENCOUNTER — Other Ambulatory Visit (HOSPITAL_COMMUNITY): Payer: Self-pay

## 2020-12-02 MED ORDER — INSULIN LISPRO (1 UNIT DIAL) 100 UNIT/ML (KWIKPEN)
20.0000 [IU] | PEN_INJECTOR | Freq: Three times a day (TID) | SUBCUTANEOUS | 2 refills | Status: AC
  Filled 2020-12-02: qty 18, 30d supply, fill #0
  Filled 2020-12-31: qty 18, 30d supply, fill #1
  Filled 2021-11-16: qty 18, 30d supply, fill #2

## 2020-12-02 NOTE — Addendum Note (Signed)
Addended by: Gardenia Phlegm on: 12/02/2020 07:59 AM   Modules accepted: Orders

## 2020-12-31 ENCOUNTER — Other Ambulatory Visit (HOSPITAL_COMMUNITY): Payer: Self-pay

## 2020-12-31 ENCOUNTER — Other Ambulatory Visit: Payer: Self-pay | Admitting: Student

## 2020-12-31 ENCOUNTER — Other Ambulatory Visit: Payer: Self-pay | Admitting: Internal Medicine

## 2020-12-31 DIAGNOSIS — E1165 Type 2 diabetes mellitus with hyperglycemia: Secondary | ICD-10-CM

## 2020-12-31 MED ORDER — LANTUS SOLOSTAR 100 UNIT/ML ~~LOC~~ SOPN
80.0000 [IU] | PEN_INJECTOR | Freq: Every day | SUBCUTANEOUS | 0 refills | Status: DC
Start: 2020-12-31 — End: 2021-04-08
  Filled 2020-12-31 – 2021-01-21 (×2): qty 24, 30d supply, fill #0

## 2020-12-31 MED ORDER — METFORMIN HCL ER 500 MG PO TB24
500.0000 mg | ORAL_TABLET | Freq: Every day | ORAL | 0 refills | Status: DC
Start: 2020-12-31 — End: 2021-04-08
  Filled 2020-12-31 – 2021-01-21 (×2): qty 30, 30d supply, fill #0

## 2020-12-31 MED FILL — Metoprolol Tartrate Tab 25 MG: ORAL | 30 days supply | Qty: 60 | Fill #1 | Status: AC

## 2020-12-31 NOTE — Telephone Encounter (Signed)
Looks like he is well overdue for an office visit. 30d refill will be sent to the pharmacy.

## 2020-12-31 NOTE — Telephone Encounter (Signed)
Last visit 04/20/20 with instructions to return in Would recommend MD fill x 1 with no additional refills And have front office assist patient with an appt.Stephen Spittle Cassady9/15/202212:03 PM

## 2021-01-08 ENCOUNTER — Other Ambulatory Visit (HOSPITAL_COMMUNITY): Payer: Self-pay

## 2021-01-21 ENCOUNTER — Other Ambulatory Visit: Payer: Self-pay | Admitting: Internal Medicine

## 2021-01-21 ENCOUNTER — Other Ambulatory Visit: Payer: Self-pay | Admitting: Student

## 2021-01-21 ENCOUNTER — Other Ambulatory Visit (HOSPITAL_COMMUNITY): Payer: Self-pay

## 2021-01-21 DIAGNOSIS — I10 Essential (primary) hypertension: Secondary | ICD-10-CM

## 2021-01-25 ENCOUNTER — Other Ambulatory Visit: Payer: Self-pay | Admitting: Internal Medicine

## 2021-01-25 ENCOUNTER — Other Ambulatory Visit: Payer: Self-pay | Admitting: Student

## 2021-01-25 ENCOUNTER — Other Ambulatory Visit (HOSPITAL_COMMUNITY): Payer: Self-pay

## 2021-01-25 DIAGNOSIS — I10 Essential (primary) hypertension: Secondary | ICD-10-CM

## 2021-01-26 ENCOUNTER — Other Ambulatory Visit (HOSPITAL_COMMUNITY): Payer: Self-pay

## 2021-01-26 MED ORDER — AMLODIPINE BESYLATE 5 MG PO TABS
5.0000 mg | ORAL_TABLET | Freq: Every day | ORAL | 2 refills | Status: DC
  Filled 2021-01-26 – 2021-02-11 (×2): qty 30, 30d supply, fill #0
  Filled 2021-04-08: qty 30, 30d supply, fill #1
  Filled 2021-05-26: qty 30, 30d supply, fill #2

## 2021-01-26 MED ORDER — METOPROLOL TARTRATE 25 MG PO TABS
25.0000 mg | ORAL_TABLET | Freq: Two times a day (BID) | ORAL | 2 refills | Status: DC
  Filled 2021-01-26 – 2021-02-11 (×2): qty 60, 30d supply, fill #0
  Filled 2021-04-08: qty 60, 30d supply, fill #1
  Filled 2021-05-26: qty 60, 30d supply, fill #2

## 2021-01-26 MED ORDER — LOSARTAN POTASSIUM 50 MG PO TABS
50.0000 mg | ORAL_TABLET | Freq: Every day | ORAL | 2 refills | Status: DC
  Filled 2021-01-26 – 2021-02-11 (×2): qty 30, 30d supply, fill #0
  Filled 2021-04-08: qty 30, 30d supply, fill #1
  Filled 2021-05-26: qty 30, 30d supply, fill #2

## 2021-01-27 ENCOUNTER — Encounter: Payer: Self-pay | Admitting: Internal Medicine

## 2021-02-03 ENCOUNTER — Other Ambulatory Visit (HOSPITAL_COMMUNITY): Payer: Self-pay

## 2021-02-08 ENCOUNTER — Other Ambulatory Visit (HOSPITAL_COMMUNITY): Payer: Self-pay

## 2021-02-11 ENCOUNTER — Other Ambulatory Visit (HOSPITAL_COMMUNITY): Payer: Self-pay

## 2021-04-08 ENCOUNTER — Other Ambulatory Visit: Payer: Self-pay | Admitting: Internal Medicine

## 2021-04-08 ENCOUNTER — Other Ambulatory Visit (HOSPITAL_COMMUNITY): Payer: Self-pay

## 2021-04-08 DIAGNOSIS — E1165 Type 2 diabetes mellitus with hyperglycemia: Secondary | ICD-10-CM

## 2021-04-08 MED ORDER — LANTUS SOLOSTAR 100 UNIT/ML ~~LOC~~ SOPN
80.0000 [IU] | PEN_INJECTOR | Freq: Every day | SUBCUTANEOUS | 0 refills | Status: DC
  Filled 2021-04-08: qty 24, 30d supply, fill #0

## 2021-04-08 MED ORDER — METFORMIN HCL ER 500 MG PO TB24
500.0000 mg | ORAL_TABLET | Freq: Every day | ORAL | 0 refills | Status: DC
  Filled 2021-04-08: qty 30, 30d supply, fill #0

## 2021-04-08 NOTE — Telephone Encounter (Signed)
Received refill requests from [facility] for metformin and Lantus. Patient's last OV 04/20/20. Per a MyChart message on 01/27/21 patient no longer resides in [place]. Attempted to contact patient today for tele appt. No answer. Left message on VM requesting return call.

## 2021-04-09 ENCOUNTER — Other Ambulatory Visit (HOSPITAL_COMMUNITY): Payer: Self-pay

## 2021-04-16 ENCOUNTER — Other Ambulatory Visit (HOSPITAL_COMMUNITY): Payer: Self-pay

## 2021-05-26 ENCOUNTER — Other Ambulatory Visit (HOSPITAL_COMMUNITY): Payer: Self-pay

## 2021-05-26 ENCOUNTER — Other Ambulatory Visit: Payer: Self-pay | Admitting: Internal Medicine

## 2021-05-26 DIAGNOSIS — E1165 Type 2 diabetes mellitus with hyperglycemia: Secondary | ICD-10-CM

## 2021-05-27 ENCOUNTER — Other Ambulatory Visit (HOSPITAL_COMMUNITY): Payer: Self-pay

## 2021-05-27 ENCOUNTER — Other Ambulatory Visit: Payer: Self-pay | Admitting: Internal Medicine

## 2021-05-27 DIAGNOSIS — E1165 Type 2 diabetes mellitus with hyperglycemia: Secondary | ICD-10-CM

## 2021-05-28 ENCOUNTER — Other Ambulatory Visit (HOSPITAL_COMMUNITY): Payer: Self-pay

## 2021-06-03 ENCOUNTER — Other Ambulatory Visit (HOSPITAL_COMMUNITY): Payer: Self-pay

## 2021-06-11 ENCOUNTER — Other Ambulatory Visit (HOSPITAL_COMMUNITY): Payer: Self-pay

## 2021-06-30 ENCOUNTER — Other Ambulatory Visit (HOSPITAL_COMMUNITY): Payer: Self-pay

## 2021-08-03 ENCOUNTER — Other Ambulatory Visit (HOSPITAL_COMMUNITY): Payer: Self-pay

## 2021-08-03 ENCOUNTER — Other Ambulatory Visit: Payer: Self-pay | Admitting: Internal Medicine

## 2021-08-03 DIAGNOSIS — I10 Essential (primary) hypertension: Secondary | ICD-10-CM

## 2021-08-04 ENCOUNTER — Other Ambulatory Visit: Payer: Self-pay | Admitting: Internal Medicine

## 2021-08-04 ENCOUNTER — Other Ambulatory Visit (HOSPITAL_COMMUNITY): Payer: Self-pay

## 2021-08-04 ENCOUNTER — Encounter: Payer: Self-pay | Admitting: *Deleted

## 2021-08-04 DIAGNOSIS — I10 Essential (primary) hypertension: Secondary | ICD-10-CM

## 2021-08-04 DIAGNOSIS — E1165 Type 2 diabetes mellitus with hyperglycemia: Secondary | ICD-10-CM

## 2021-11-11 ENCOUNTER — Other Ambulatory Visit: Payer: Self-pay | Admitting: Internal Medicine

## 2021-11-11 ENCOUNTER — Other Ambulatory Visit: Payer: Self-pay

## 2021-11-11 ENCOUNTER — Other Ambulatory Visit (HOSPITAL_COMMUNITY): Payer: Self-pay

## 2021-11-11 DIAGNOSIS — E1165 Type 2 diabetes mellitus with hyperglycemia: Secondary | ICD-10-CM

## 2021-11-11 MED ORDER — LANTUS SOLOSTAR 100 UNIT/ML ~~LOC~~ SOPN
80.0000 [IU] | PEN_INJECTOR | Freq: Every day | SUBCUTANEOUS | 0 refills | Status: DC
  Filled 2021-11-11 – 2021-11-12 (×3): qty 24, 30d supply, fill #0

## 2021-11-11 MED ORDER — METFORMIN HCL ER 500 MG PO TB24
500.0000 mg | ORAL_TABLET | Freq: Every day | ORAL | 0 refills | Status: DC
  Filled 2021-11-11: qty 30, 30d supply, fill #0

## 2021-11-12 ENCOUNTER — Other Ambulatory Visit (HOSPITAL_COMMUNITY): Payer: Self-pay

## 2021-11-15 ENCOUNTER — Other Ambulatory Visit (HOSPITAL_COMMUNITY): Payer: Self-pay

## 2021-11-15 ENCOUNTER — Other Ambulatory Visit: Payer: Self-pay | Admitting: Internal Medicine

## 2021-11-15 ENCOUNTER — Encounter: Payer: Self-pay | Admitting: Internal Medicine

## 2021-11-15 DIAGNOSIS — I10 Essential (primary) hypertension: Secondary | ICD-10-CM

## 2021-11-15 MED ORDER — INSULIN PEN NEEDLE 32G X 6 MM MISC
0 refills | Status: AC
  Filled 2021-11-15: qty 100, 25d supply, fill #0

## 2021-11-15 NOTE — Telephone Encounter (Signed)
Patient sent letter via my chart and copy was also mailed to current address on file.

## 2021-11-16 ENCOUNTER — Other Ambulatory Visit (HOSPITAL_COMMUNITY): Payer: Self-pay

## 2021-11-16 ENCOUNTER — Other Ambulatory Visit: Payer: Self-pay

## 2021-11-16 MED ORDER — LOSARTAN POTASSIUM 50 MG PO TABS
50.0000 mg | ORAL_TABLET | Freq: Every day | ORAL | 2 refills | Status: AC
  Filled 2021-11-16: qty 30, 30d supply, fill #0
  Filled 2021-12-13: qty 30, 30d supply, fill #1

## 2021-11-16 MED ORDER — AMLODIPINE BESYLATE 5 MG PO TABS
5.0000 mg | ORAL_TABLET | Freq: Every day | ORAL | 2 refills | Status: AC
  Filled 2021-11-16: qty 30, 30d supply, fill #0
  Filled 2021-11-17 – 2021-12-13 (×2): qty 30, 30d supply, fill #1

## 2021-11-17 ENCOUNTER — Other Ambulatory Visit: Payer: Self-pay | Admitting: Internal Medicine

## 2021-11-17 ENCOUNTER — Other Ambulatory Visit (HOSPITAL_COMMUNITY): Payer: Self-pay

## 2021-11-17 DIAGNOSIS — I10 Essential (primary) hypertension: Secondary | ICD-10-CM

## 2021-11-17 MED ORDER — METOPROLOL TARTRATE 25 MG PO TABS
25.0000 mg | ORAL_TABLET | Freq: Two times a day (BID) | ORAL | 2 refills | Status: AC
  Filled 2021-11-17 – 2021-12-13 (×2): qty 60, 30d supply, fill #0

## 2021-11-17 NOTE — Telephone Encounter (Signed)
LOV 04/2020. I called pt to schedule an appt. Pt stated he's in [place] "right now" and willing to schedule an appt. I asked when will he be back in [place] - he stated next year "somteime next year".

## 2021-11-18 ENCOUNTER — Other Ambulatory Visit (HOSPITAL_COMMUNITY): Payer: Self-pay

## 2021-11-26 ENCOUNTER — Other Ambulatory Visit (HOSPITAL_COMMUNITY): Payer: Self-pay

## 2021-12-13 ENCOUNTER — Other Ambulatory Visit: Payer: Self-pay | Admitting: Internal Medicine

## 2021-12-13 ENCOUNTER — Other Ambulatory Visit (HOSPITAL_COMMUNITY): Payer: Self-pay

## 2021-12-13 DIAGNOSIS — E1165 Type 2 diabetes mellitus with hyperglycemia: Secondary | ICD-10-CM

## 2021-12-14 ENCOUNTER — Other Ambulatory Visit (HOSPITAL_COMMUNITY): Payer: Self-pay

## 2021-12-14 NOTE — Telephone Encounter (Signed)
Return call from pt - informed it will be difficult to refill his meds w/o an in person appt. Stated he understands. I asked if he will be able to find a doctor where he's living now, stated he will.

## 2021-12-14 NOTE — Telephone Encounter (Signed)
LOV 04/20/2020. Called pt - no answer, left message to call the office. The last time I called pt, [place].

## 2021-12-15 MED ORDER — LANTUS SOLOSTAR 100 UNIT/ML ~~LOC~~ SOPN
80.0000 [IU] | PEN_INJECTOR | Freq: Every day | SUBCUTANEOUS | 0 refills | Status: AC
  Filled 2021-12-15: qty 24, 30d supply, fill #0

## 2021-12-15 MED ORDER — METFORMIN HCL ER 500 MG PO TB24
500.0000 mg | ORAL_TABLET | Freq: Every day | ORAL | 0 refills | Status: AC
  Filled 2021-12-15: qty 30, 30d supply, fill #0

## 2021-12-16 ENCOUNTER — Other Ambulatory Visit (HOSPITAL_COMMUNITY): Payer: Self-pay

## 2021-12-22 ENCOUNTER — Other Ambulatory Visit (HOSPITAL_COMMUNITY): Payer: Self-pay

## 2022-06-21 ENCOUNTER — Telehealth: Payer: Self-pay | Admitting: Dietician

## 2022-06-21 NOTE — Telephone Encounter (Signed)
Calling to see if patient would like to continue care at our office. Left voicemail for return call

## 2022-12-11 IMAGING — CT CT ANGIO CHEST-ABD-PELV FOR DISSECTION W/ AND WO/W CM
2 of 7 series · 14 of 46 positions shown, 16 images · IV contrast (Omnipaque or Isovue)
Comparison: 05/25/2020

CLINICAL DATA: Tachycardia, headache, neck pain radiating to back

EXAM:
CT ANGIOGRAPHY CHEST, ABDOMEN AND PELVIS
TECHNIQUE: Non-contrast CT of the chest was initially obtained.

[Series 7: axial arterial · axial · arterial · 0.79mm/px · z∈[+631,+1237]mm · 11 of 232 slices shown, 13 images]
[im 15/232  soft-tissue]
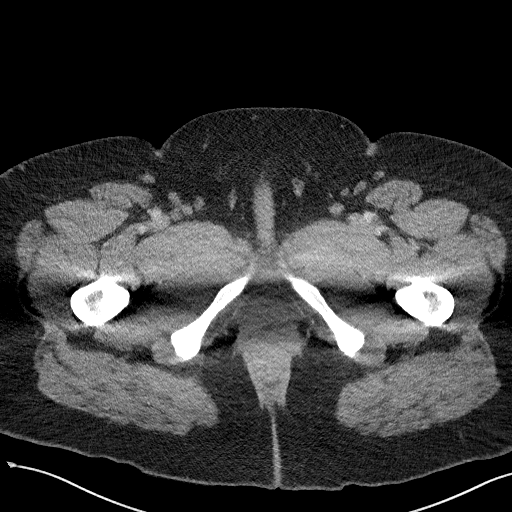
[im 15/232  bone]
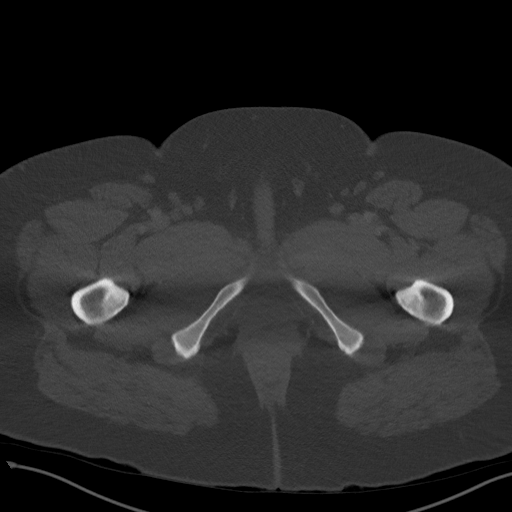
[im 44/232  soft-tissue]
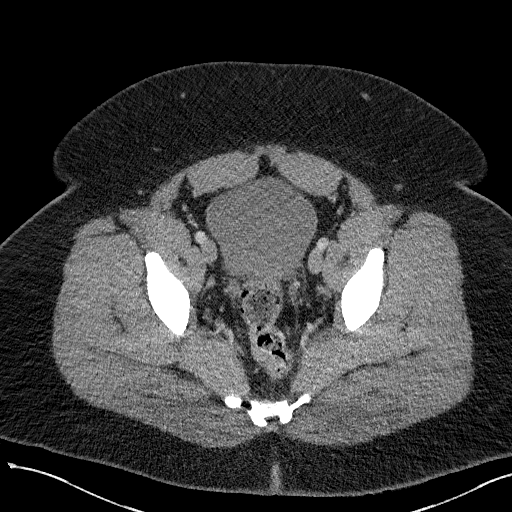
[im 58/232  soft-tissue]
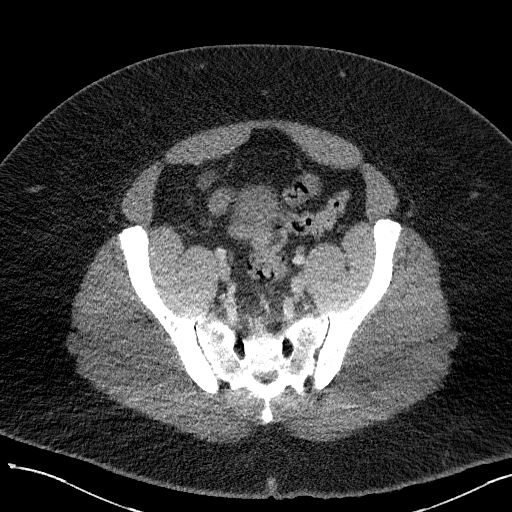
[im 73/232  soft-tissue]
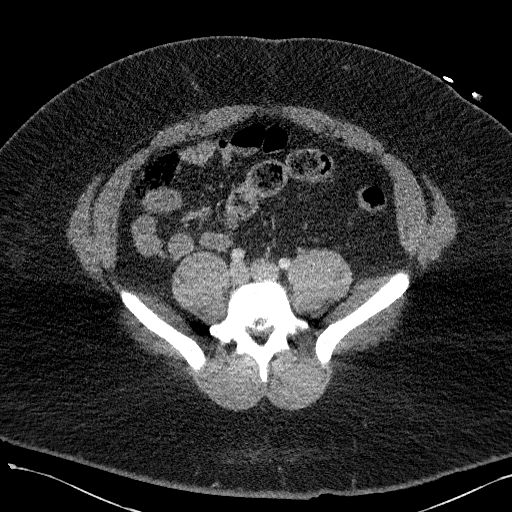
[im 102/232  soft-tissue]
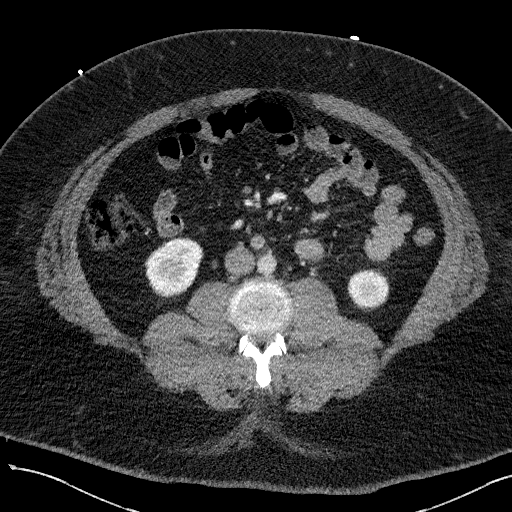
[im 116/232  soft-tissue]
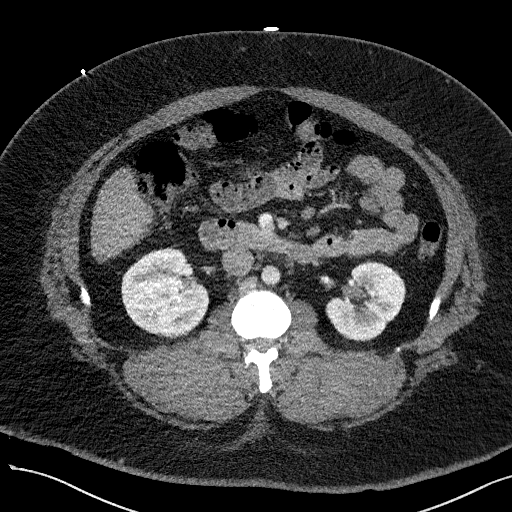
[im 130/232  soft-tissue]
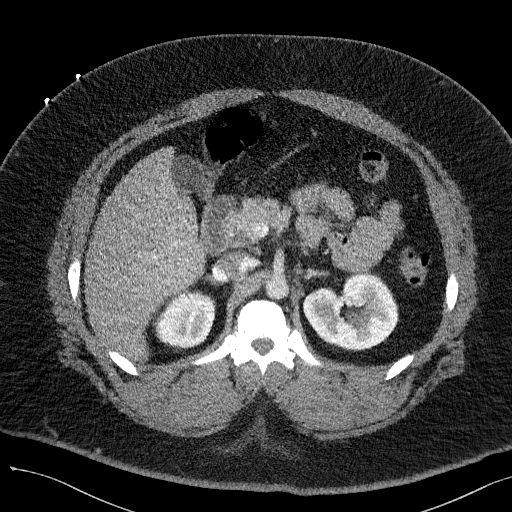
[im 159/232  soft-tissue]
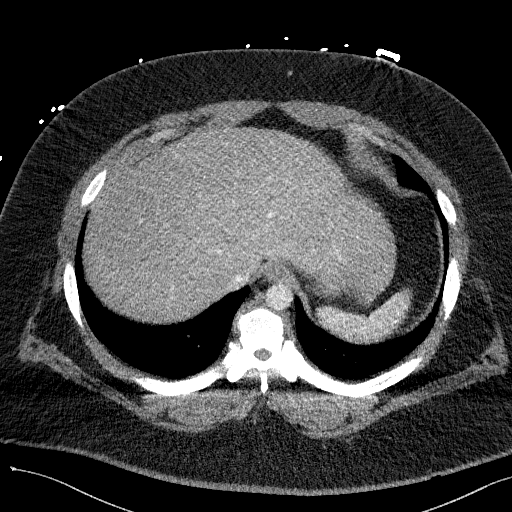
[im 174/232  soft-tissue]
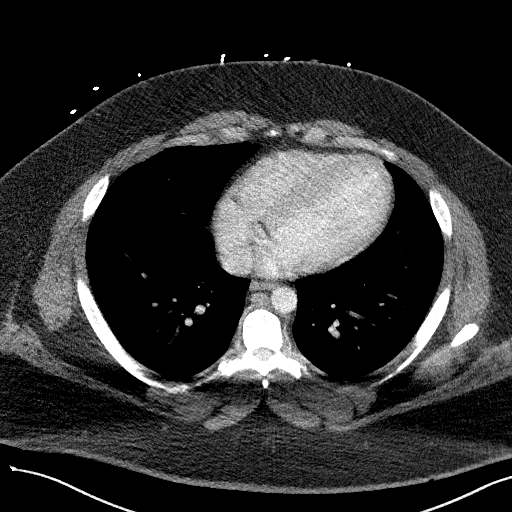
[im 174/232  bone]
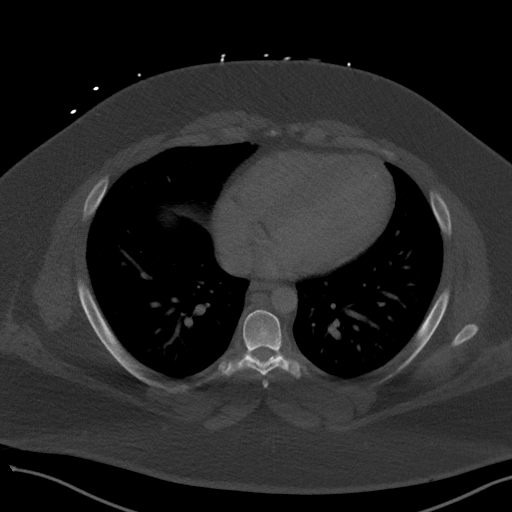
[im 188/232  soft-tissue]
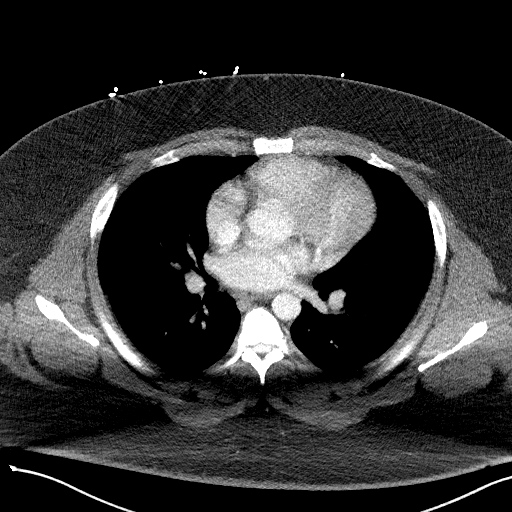
[im 217/232  soft-tissue]
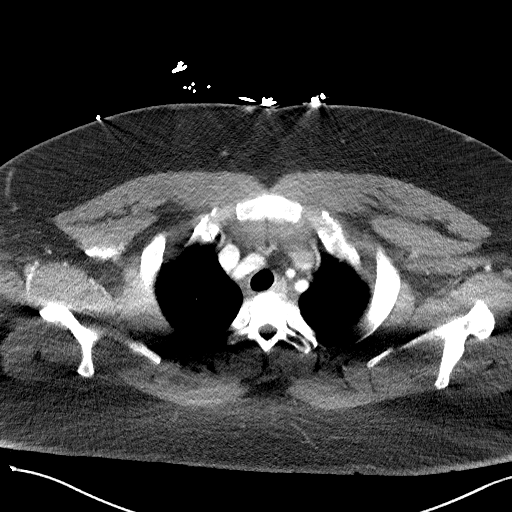

[Series 11: cor soft · coronal · 0.92mm/px · 3 of 183 slices shown]
[im 46/183  soft-tissue]
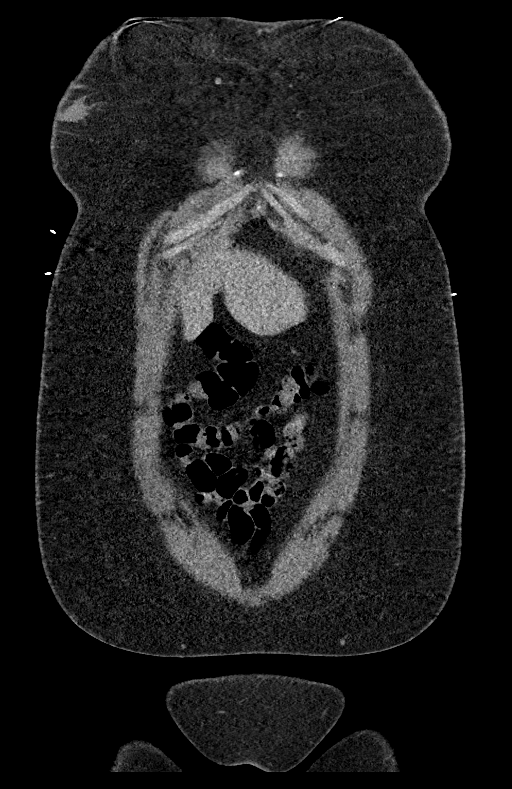
[im 92/183  soft-tissue]
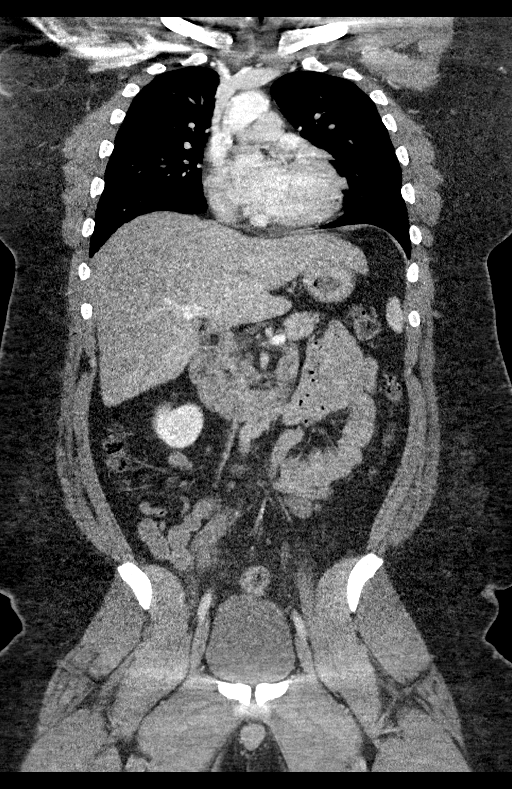
[im 137/183  soft-tissue]
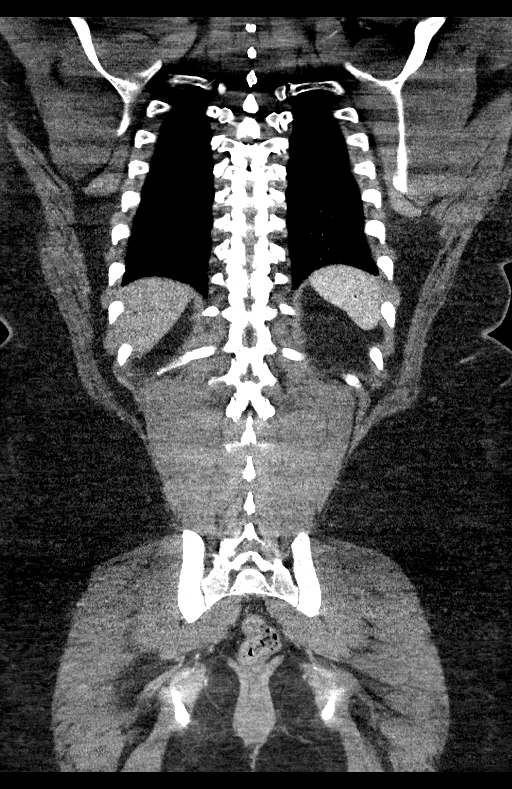

[14 of 46 positions shown; findings below may reference images not displayed]

Multidetector CT imaging through the chest, abdomen and pelvis was
performed using the standard protocol during bolus administration of
intravenous contrast. Multiplanar reconstructed images and MIPs were
obtained and reviewed to evaluate the vascular anatomy.

CONTRAST:  125mL OMNIPAQUE IOHEXOL 350 MG/ML SOLN
FINDINGS: CTA CHEST FINDINGS

Cardiovascular: This is a technically adequate evaluation of the
thoracic aorta. No evidence of aneurysm or dissection.

The heart is unremarkable without pericardial effusion. There is
insufficient contrast enhancement of the pulmonary vasculature to
assess for pulmonary emboli.

Mediastinum/Nodes: No enlarged mediastinal, hilar, or axillary lymph
nodes. Thyroid gland, trachea, and esophagus demonstrate no
significant findings.

Lungs/Pleura: No acute airspace disease, effusion, or pneumothorax.
Central airways are patent.

Musculoskeletal: No acute or destructive bony lesions. Reconstructed
images demonstrate no additional findings.

Review of the MIP images confirms the above findings.

CTA ABDOMEN AND PELVIS FINDINGS

VASCULAR

Evaluation of the intra-abdominal arterial structures is limited by
the timing of contrast bolus as well as patient body habitus.

Aorta: Normal caliber aorta without aneurysm, dissection, vasculitis
or significant stenosis.

Celiac: Patent without evidence of aneurysm, dissection, vasculitis
or significant stenosis.

SMA: Patent without evidence of aneurysm, dissection, vasculitis or
significant stenosis.

Renals: Both renal arteries are patent without evidence of aneurysm,
dissection, vasculitis, fibromuscular dysplasia or significant
stenosis.

IMA: Patent without evidence of aneurysm, dissection, vasculitis or
significant stenosis.

Inflow: Patent without evidence of aneurysm, dissection, vasculitis
or significant stenosis.

Veins: No obvious venous abnormality within the limitations of this
arterial phase study.

Review of the MIP images confirms the above findings.

NON-VASCULAR

Hepatobiliary: Mild diffuse hepatic steatosis. No focal abnormality.
The gallbladder is unremarkable.

Pancreas: Unremarkable. No pancreatic ductal dilatation or
surrounding inflammatory changes.

Spleen: Normal in size without focal abnormality.

Adrenals/Urinary Tract: Adrenal glands are unremarkable. Kidneys are
normal, without renal calculi, focal lesion, or hydronephrosis.
Bladder is unremarkable.

Stomach/Bowel: No bowel obstruction or ileus. Normal appendix right
lower quadrant. No bowel wall thickening or inflammatory change.

Lymphatic: No pathologic adenopathy.

Reproductive: Prostate is unremarkable.

Other: No free fluid or free gas. Small fat containing umbilical
hernia.

Musculoskeletal: No acute or destructive bony lesions. Reconstructed
images demonstrate no additional findings.

Review of the MIP images confirms the above findings.
IMPRESSION: 1. No evidence of thoracic or abdominal aortic aneurysm or
dissection.
2. No acute intrathoracic, intra-abdominal, or intrapelvic process.
3. Mild hepatic steatosis.
